# Patient Record
Sex: Male | Born: 1990 | Race: White | Hispanic: No | Marital: Single | Smoking: Never smoker
Health system: Southern US, Community
[De-identification: ages and names within clinical notes are randomized; demographics above are authoritative.]

## PROBLEM LIST (undated history)

## (undated) DIAGNOSIS — I10 Essential (primary) hypertension: Secondary | ICD-10-CM

## (undated) HISTORY — PX: TONSILLECTOMY: SUR1361

## (undated) HISTORY — DX: Essential (primary) hypertension: I10

---

## 2016-12-19 ENCOUNTER — Emergency Department
Admission: EM | Admit: 2016-12-19 | Discharge: 2016-12-19 | Disposition: A | Discharge status: Home / Self Care | Payer: BLUE CROSS/BLUE SHIELD | Attending: Emergency Medicine | Admitting: Emergency Medicine

## 2016-12-19 ENCOUNTER — Encounter: Payer: Self-pay | Admitting: Emergency Medicine

## 2016-12-19 DIAGNOSIS — W260XXA Contact with knife, initial encounter: Secondary | ICD-10-CM | POA: Insufficient documentation

## 2016-12-19 DIAGNOSIS — S61012A Laceration without foreign body of left thumb without damage to nail, initial encounter: Secondary | ICD-10-CM | POA: Diagnosis present

## 2016-12-19 DIAGNOSIS — Y929 Unspecified place or not applicable: Secondary | ICD-10-CM | POA: Insufficient documentation

## 2016-12-19 DIAGNOSIS — Z23 Encounter for immunization: Secondary | ICD-10-CM | POA: Diagnosis not present

## 2016-12-19 DIAGNOSIS — Y998 Other external cause status: Secondary | ICD-10-CM | POA: Insufficient documentation

## 2016-12-19 DIAGNOSIS — Y9389 Activity, other specified: Secondary | ICD-10-CM | POA: Insufficient documentation

## 2016-12-19 MED ORDER — TETANUS-DIPHTH-ACELL PERTUSSIS 5-2.5-18.5 LF-MCG/0.5 IM SUSP
0.5000 mL | Freq: Once | INTRAMUSCULAR | Status: AC
  Administered 2016-12-19: 0.5 mL via INTRAMUSCULAR
  Filled 2016-12-19: qty 0.5

## 2016-12-19 MED ORDER — LIDOCAINE HCL (PF) 1 % IJ SOLN
INTRAMUSCULAR | Status: AC
  Administered 2016-12-19: 5 mL
  Filled 2016-12-19: qty 5

## 2016-12-19 MED ORDER — CEPHALEXIN 500 MG PO CAPS: 500.0000 mg | ORAL_CAPSULE | Freq: Two times a day (BID) | ORAL | 0 refills | Status: AC

## 2016-12-19 MED ORDER — LIDOCAINE HCL 1 % IJ SOLN
5.0000 mL | Freq: Once | INTRAMUSCULAR | Status: AC
  Administered 2016-12-19: 5 mL

## 2016-12-19 NOTE — ED Provider Notes (Signed)
[facility] Emergency Department Provider Note  ____________________________________________  Time seen: Approximately 11:33 PM  I have reviewed the triage vital signs and the nursing notes.   HISTORY  Chief Complaint Laceration   HPI Kenneth Craig is a 25 y.o. male presenting to the emergency department with a 3 cm laceration localized to the dorsal aspect of the first digit, left of the skin overlying the first metacarpal. Patient states that he was playing with a new pocket knife when he accidentally cut himself. Patient denies prior traumas or surgeries to the left upper extremity. He irrigated and applied pressure to the wound immediately.  History reviewed. No pertinent past medical history.  There are no active problems to display for this patient.   History reviewed. No pertinent surgical history.  Prior to Admission medications   Medication Sig Start Date End Date Taking? Authorizing Provider  cephALEXin (KEFLEX) 500 MG capsule Take 1 capsule (500 mg total) by mouth 2 (two) times daily. 12/19/16 12/26/16  Orvil FeilJaclyn M Raymondo Garcialopez, PA-C    Allergies Patient has no known allergies.  No family history on file.  Social History Social History  Substance Use Topics  . Smoking status: Never Smoker  . Smokeless tobacco: Never Used  . Alcohol use No    Review of Systems  Constitutional: Negative for fever/chills Respiratory: Negative for shortness of breath. Musculoskeletal: Has left first digit pain.  Skin: Has laceration.  Neurological: Negative for headaches, focal weakness or numbness. ____________________________________________   PHYSICAL EXAM:  VITAL SIGNS: ED Triage Vitals  Enc Vitals Group     BP 12/19/16 2135 136/84     Pulse Rate 12/19/16 2135 76     Resp 12/19/16 2255 18     Temp 12/19/16 2135 97.9 F (36.6 C)     Temp Source 12/19/16 2135 Oral     SpO2 12/19/16 2135 100 %     Weight --      Height --      Head Circumference --       Peak Flow --      Pain Score 12/19/16 2144 6     Pain Loc --      Pain Edu? --      Excl. in [place]? --      Constitutional: Alert and oriented. Well appearing and in no acute distress. Neck: FROM.  Cardiovascular: Good peripheral circulation. Respiratory: Normal respiratory effort.  No retractions. Lungs CTAB. Musculoskeletal:  Patient has active flexion and extension at the thumb, left. Palpable radial and ulnar pulses, left. Patient is able to perform flexion and extension at the left wrist. Neurologic:  Normal speech and language. No gross focal neurologic deficits are appreciated. Skin:  Patient has a 3 cm linear laceration localized to the dorsal aspect of the left first digit. Laceration is localized to the skin overlying the first metacarpal.   ____________________________________________   LABS (all labs ordered are listed, but only abnormal results are displayed)  Labs Reviewed - No data to display ____________________________________________  EKG  ____________________________________________  RADIOLOGY   ____________________________________________   PROCEDURES  Procedure(s) performed:  LACERATION REPAIR Performed by: Orvil FeilJaclyn M Essie Gehret Authorized by: Orvil FeilJaclyn M Lida Berkery Consent: Verbal consent obtained. Risks and benefits: risks, benefits and alternatives were discussed Consent given by: patient Patient identity confirmed: provided demographic data Prepped and Draped in normal sterile fashion Wound explored  Laceration Location: Left first digit  Laceration Length: 3 cm  No Foreign Bodies seen or palpated  Anesthesia: local infiltration  Local  anesthetic: lidocaine 1% without epinephrine  Anesthetic total: 5 ml  Irrigation method: syringe Amount of cleaning: standard  Skin closure: 5-0 Ethilon.   Number of sutures: 6  Technique: Simple interrupted   Patient tolerance: Patient tolerated the procedure well with no immediate  complications.  ____________________________________________   INITIAL IMPRESSION / ASSESSMENT AND PLAN / ED COURSE  Clinical Course     Pertinent labs & imaging results that were available during my care of the patient were reviewed by me and considered in my medical decision making (see chart for details).  Assessment and Plan:   Patient underwent laceration repair in the emergency department tonight. Patient tolerated the procedure well. Patient was advised to have sutures removed in 8 days. Patient's tetanus status was updated in the emergency department tonight. Patient was discharged with Keflex. All patient questions were answered. ____________________________________________   FINAL CLINICAL IMPRESSION(S) / ED DIAGNOSES  Final diagnoses:  Laceration of left thumb without foreign body without damage to nail, initial encounter    Discharge Medication List as of 12/19/2016 10:41 PM    START taking these medications   Details  cephALEXin (KEFLEX) 500 MG capsule Take 1 capsule (500 mg total) by mouth 2 (two) times daily., Starting Wed 12/19/2016, Until Wed 12/26/2016, Print        Note:  This document was prepared using Dragon voice recognition software and may include unintentional dictation errors.    Orvil FeilJaclyn M Takeria Marquina, PA-C 12/19/16 [id number]    Loleta Roseory Forbach, MD 12/20/16 0000

## 2016-12-19 NOTE — ED Triage Notes (Addendum)
Pt to ED via POV for laceration to LFT thumb from cutting open a box tonight with pocket knife. Lac still slightly bleeding at this time, otherwise injury is clean, no other injuries noted

## 2019-07-21 ENCOUNTER — Emergency Department
Admission: EM | Admit: 2019-07-21 | Discharge: 2019-07-21 | Disposition: A | Discharge status: Home / Self Care | Payer: BC Managed Care – PPO | Source: Home / Self Care | Attending: Emergency Medicine | Admitting: Emergency Medicine

## 2019-07-21 ENCOUNTER — Encounter: Payer: Self-pay | Admitting: Emergency Medicine

## 2019-07-21 ENCOUNTER — Other Ambulatory Visit: Payer: Self-pay

## 2019-07-21 DIAGNOSIS — M7918 Myalgia, other site: Secondary | ICD-10-CM | POA: Insufficient documentation

## 2019-07-21 DIAGNOSIS — J988 Other specified respiratory disorders: Secondary | ICD-10-CM | POA: Insufficient documentation

## 2019-07-21 DIAGNOSIS — U071 COVID-19: Secondary | ICD-10-CM

## 2019-07-21 DIAGNOSIS — R509 Fever, unspecified: Secondary | ICD-10-CM | POA: Diagnosis present

## 2019-07-21 MED ORDER — ONDANSETRON 4 MG PO TBDP: 4.0000 mg | ORAL_TABLET | Freq: Three times a day (TID) | ORAL | 0 refills | Status: DC | PRN

## 2019-07-21 NOTE — ED Triage Notes (Signed)
C/O fever, chills, nausea, lower back pain x 4 days.  Ibuprofen last taken at 0130.  STates was seen through Urgent Care yesterday and was tested for COVID.  No results yet.

## 2019-07-21 NOTE — ED Provider Notes (Signed)
[facility] Emergency Department Provider Note   ____________________________________________    I have reviewed the triage vital signs and the nursing notes.   HISTORY  Chief Complaint Fever     HPI Kenneth Craig is a 28 y.o. male reports he has had fever and body aches for approximately 3 to 4 days.  He had coronavirus testing done yesterday but does not have the results yet.  Presents today because he reports decreased p.o. intake.  He reports he is tolerating liquids but occasionally feels nauseated.  Denies abdominal pain.  No diarrhea.  No shortness of breath or cough  History reviewed. No pertinent past medical history.  There are no active problems to display for this patient.   History reviewed. No pertinent surgical history.  Prior to Admission medications   Medication Sig Start Date End Date Taking? Authorizing Provider  ondansetron (ZOFRAN ODT) 4 MG disintegrating tablet Take 1 tablet (4 mg total) by mouth every 8 (eight) hours as needed. 07/21/19   Lavonia Drafts, MD     Allergies Patient has no known allergies.  No family history on file.  Social History Social History   Tobacco Use  . Smoking status: Never Smoker  . Smokeless tobacco: Never Used  Substance Use Topics  . Alcohol use: No  . Drug use: No    Review of Systems  Constitutional: Positive fever  ENT: No sore throat.   Gastrointestinal: No abdominal pain.    Genitourinary: Negative for dysuria.  Normal urination Musculoskeletal: Myalgias Skin: Negative for rash. Neurological: Occasional headaches    ____________________________________________   PHYSICAL EXAM:  VITAL SIGNS: ED Triage Vitals [07/21/19 0749]  Enc Vitals Group     BP (!) 158/96     Pulse Rate (!) 120     Resp 18     Temp (!) 100.5 F (38.1 C)     Temp Source Oral     SpO2 97 %     Weight (!) 155 kg (341 lb 11.4 oz)     Height 1.753 m (5\' 9" )     Head Circumference    Peak Flow      Pain Score 8     Pain Loc      Pain Edu?      Excl. in [place]?      Constitutional: Alert and oriented.  Eyes: Conjunctivae are normal.  Head: Atraumatic. Nose: No congestion/rhinnorhea. Mouth/Throat: Mucous membranes are moist.   Cardiovascular: Normal rate, regular rhythm.  Respiratory: Normal respiratory effort.  No retractions.  Musculoskeletal: No lower extremity tenderness nor edema.   Neurologic:  Normal speech and language. No gross focal neurologic deficits are appreciated.   Skin:  Skin is warm, dry and intact. No rash noted.   ____________________________________________   LABS (all labs ordered are listed, but only abnormal results are displayed)  Labs Reviewed - No data to display ____________________________________________  EKG   ____________________________________________  RADIOLOGY  None ____________________________________________   PROCEDURES  Procedure(s) performed: No  Procedures   Critical Care performed: No ____________________________________________   INITIAL IMPRESSION / ASSESSMENT AND PLAN / ED COURSE  Pertinent labs & imaging results that were available during my care of the patient were reviewed by me and considered in my medical decision making (see chart for details).  Patient presents with symptoms consistent with COVID-19.  Overall he is well-appearing and in no acute distress.  Minimally tachycardic likely from fever.  Clinically does not appear dehydrated is tolerating p.o., will add  Zofran to help him tolerate p.o.'s.  Strict quarantine discussed with patient   ____________________________________________   FINAL CLINICAL IMPRESSION(S) / ED DIAGNOSES  Final diagnoses:  COVID-19      NEW MEDICATIONS STARTED DURING THIS VISIT:  New Prescriptions   ONDANSETRON (ZOFRAN ODT) 4 MG DISINTEGRATING TABLET    Take 1 tablet (4 mg total) by mouth every 8 (eight) hours as needed.     Note:  This document  was prepared using Dragon voice recognition software and may include unintentional dictation errors.   Jene EveryKinner, Yecheskel Kurek, MD 07/21/19 [phone]

## 2019-07-22 ENCOUNTER — Emergency Department: Payer: BC Managed Care – PPO

## 2019-07-22 ENCOUNTER — Inpatient Hospital Stay
Admission: EM | Admit: 2019-07-22 | Discharge: 2019-07-27 | DRG: 872 | Disposition: A | Discharge status: Home / Self Care | Payer: BC Managed Care – PPO | Attending: Internal Medicine | Admitting: Internal Medicine

## 2019-07-22 ENCOUNTER — Encounter: Payer: Self-pay | Admitting: Emergency Medicine

## 2019-07-22 ENCOUNTER — Other Ambulatory Visit: Payer: Self-pay

## 2019-07-22 DIAGNOSIS — Z791 Long term (current) use of non-steroidal anti-inflammatories (NSAID): Secondary | ICD-10-CM

## 2019-07-22 DIAGNOSIS — M549 Dorsalgia, unspecified: Secondary | ICD-10-CM | POA: Diagnosis not present

## 2019-07-22 DIAGNOSIS — D72829 Elevated white blood cell count, unspecified: Secondary | ICD-10-CM | POA: Diagnosis not present

## 2019-07-22 DIAGNOSIS — R519 Headache, unspecified: Secondary | ICD-10-CM

## 2019-07-22 DIAGNOSIS — K219 Gastro-esophageal reflux disease without esophagitis: Secondary | ICD-10-CM | POA: Diagnosis present

## 2019-07-22 DIAGNOSIS — Z79899 Other long term (current) drug therapy: Secondary | ICD-10-CM

## 2019-07-22 DIAGNOSIS — Z20828 Contact with and (suspected) exposure to other viral communicable diseases: Secondary | ICD-10-CM | POA: Diagnosis present

## 2019-07-22 DIAGNOSIS — N179 Acute kidney failure, unspecified: Secondary | ICD-10-CM | POA: Diagnosis present

## 2019-07-22 DIAGNOSIS — A870 Enteroviral meningitis: Secondary | ICD-10-CM | POA: Diagnosis present

## 2019-07-22 DIAGNOSIS — A419 Sepsis, unspecified organism: Secondary | ICD-10-CM | POA: Diagnosis not present

## 2019-07-22 DIAGNOSIS — R51 Headache: Secondary | ICD-10-CM

## 2019-07-22 DIAGNOSIS — K760 Fatty (change of) liver, not elsewhere classified: Secondary | ICD-10-CM | POA: Diagnosis present

## 2019-07-22 DIAGNOSIS — R945 Abnormal results of liver function studies: Secondary | ICD-10-CM | POA: Diagnosis not present

## 2019-07-22 DIAGNOSIS — F1290 Cannabis use, unspecified, uncomplicated: Secondary | ICD-10-CM | POA: Diagnosis present

## 2019-07-22 DIAGNOSIS — G039 Meningitis, unspecified: Secondary | ICD-10-CM | POA: Diagnosis present

## 2019-07-22 DIAGNOSIS — R509 Fever, unspecified: Secondary | ICD-10-CM | POA: Diagnosis not present

## 2019-07-22 DIAGNOSIS — Z6841 Body Mass Index (BMI) 40.0 and over, adult: Secondary | ICD-10-CM | POA: Diagnosis not present

## 2019-07-22 DIAGNOSIS — R21 Rash and other nonspecific skin eruption: Secondary | ICD-10-CM

## 2019-07-22 DIAGNOSIS — M542 Cervicalgia: Secondary | ICD-10-CM

## 2019-07-22 DIAGNOSIS — I10 Essential (primary) hypertension: Secondary | ICD-10-CM | POA: Diagnosis present

## 2019-07-22 DIAGNOSIS — E860 Dehydration: Secondary | ICD-10-CM

## 2019-07-22 LAB — CBC WITH DIFFERENTIAL/PLATELET
Abs Immature Granulocytes: 0.15 10*3/uL — ABNORMAL HIGH (ref 0.00–0.07)
Basophils Absolute: 0.1 10*3/uL (ref 0.0–0.1)
Basophils Relative: 1 %
Eosinophils Absolute: 0 10*3/uL (ref 0.0–0.5)
Eosinophils Relative: 0 %
HCT: 44.8 % (ref 39.0–52.0)
Hemoglobin: 15 g/dL (ref 13.0–17.0)
Immature Granulocytes: 1 %
Lymphocytes Relative: 5 %
Lymphs Abs: 0.8 10*3/uL (ref 0.7–4.0)
MCH: 27.6 pg (ref 26.0–34.0)
MCHC: 33.5 g/dL (ref 30.0–36.0)
MCV: 82.4 fL (ref 80.0–100.0)
Monocytes Absolute: 1 10*3/uL (ref 0.1–1.0)
Monocytes Relative: 7 %
Neutro Abs: 13.3 10*3/uL — ABNORMAL HIGH (ref 1.7–7.7)
Neutrophils Relative %: 86 %
Platelets: 234 10*3/uL (ref 150–400)
RBC: 5.44 MIL/uL (ref 4.22–5.81)
RDW: 12.7 % (ref 11.5–15.5)
WBC: 15.4 10*3/uL — ABNORMAL HIGH (ref 4.0–10.5)
nRBC: 0 % (ref 0.0–0.2)

## 2019-07-22 LAB — COMPREHENSIVE METABOLIC PANEL
ALT: 148 U/L — ABNORMAL HIGH (ref 0–44)
AST: 46 U/L — ABNORMAL HIGH (ref 15–41)
Albumin: 3.7 g/dL (ref 3.5–5.0)
Alkaline Phosphatase: 70 U/L (ref 38–126)
Anion gap: 13 (ref 5–15)
BUN: 16 mg/dL (ref 6–20)
CO2: 26 mmol/L (ref 22–32)
Calcium: 9.2 mg/dL (ref 8.9–10.3)
Chloride: 96 mmol/L — ABNORMAL LOW (ref 98–111)
Creatinine, Ser: 1.39 mg/dL — ABNORMAL HIGH (ref 0.61–1.24)
GFR calc Af Amer: >60 mL/min (ref >60)
GFR calc non Af Amer: >60 mL/min (ref >60)
Glucose, Bld: 128 mg/dL — ABNORMAL HIGH (ref 70–99)
Potassium: 4.2 mmol/L (ref 3.5–5.1)
Sodium: 135 mmol/L (ref 135–145)
Total Bilirubin: 1.3 mg/dL — ABNORMAL HIGH (ref 0.3–1.2)
Total Protein: 8.3 g/dL — ABNORMAL HIGH (ref 6.5–8.1)

## 2019-07-22 LAB — URINALYSIS, COMPLETE (UACMP) WITH MICROSCOPIC
Bacteria, UA: NONE SEEN
Bilirubin Urine: NEGATIVE
Glucose, UA: NEGATIVE mg/dL
Hgb urine dipstick: NEGATIVE
Ketones, ur: 20 mg/dL — AB
Leukocytes,Ua: NEGATIVE
Nitrite: NEGATIVE
Protein, ur: 30 mg/dL — AB
Specific Gravity, Urine: 1.02 (ref 1.005–1.030)
pH: 5 (ref 5.0–8.0)

## 2019-07-22 LAB — SARS CORONAVIRUS 2 BY RT PCR (HOSPITAL ORDER, PERFORMED IN ~~LOC~~ HOSPITAL LAB): SARS Coronavirus 2: NEGATIVE

## 2019-07-22 LAB — PROCALCITONIN: Procalcitonin: 0.83 ng/mL

## 2019-07-22 LAB — LIPASE, BLOOD: Lipase: 19 U/L (ref 11–51)

## 2019-07-22 LAB — LACTIC ACID, PLASMA
Lactic Acid, Venous: 1.5 mmol/L (ref 0.5–1.9)
Lactic Acid, Venous: 1.5 mmol/L (ref 0.5–1.9)

## 2019-07-22 MED ORDER — VANCOMYCIN HCL 10 G IV SOLR
2500.0000 mg | Freq: Once | INTRAVENOUS | Status: DC
  Administered 2019-07-22: 2500 mg via INTRAVENOUS
  Filled 2019-07-22: qty 2500

## 2019-07-22 MED ORDER — ONDANSETRON HCL 4 MG/2ML IJ SOLN: 4.0000 mg | Freq: Four times a day (QID) | INTRAMUSCULAR | Status: DC | PRN

## 2019-07-22 MED ORDER — ACETAMINOPHEN 650 MG RE SUPP: 650.0000 mg | Freq: Four times a day (QID) | RECTAL | Status: DC | PRN

## 2019-07-22 MED ORDER — VANCOMYCIN HCL 10 G IV SOLR: 1250.0000 mg | Freq: Two times a day (BID) | INTRAVENOUS | Status: DC

## 2019-07-22 MED ORDER — KETOROLAC TROMETHAMINE 30 MG/ML IJ SOLN
15.0000 mg | INTRAMUSCULAR | Status: AC
  Administered 2019-07-22: 15 mg via INTRAVENOUS
  Filled 2019-07-22: qty 1

## 2019-07-22 MED ORDER — DEXTROSE 5 % IV SOLN
10.0000 mg/kg | Freq: Once | INTRAVENOUS | Status: AC
  Administered 2019-07-22: 17:00:00 1540 mg via INTRAVENOUS
  Filled 2019-07-22: qty 20

## 2019-07-22 MED ORDER — SODIUM CHLORIDE 0.9 % IV BOLUS
1000.0000 mL | Freq: Once | INTRAVENOUS | Status: AC
  Administered 2019-07-22: 1000 mL via INTRAVENOUS

## 2019-07-22 MED ORDER — SODIUM CHLORIDE 0.9 % IV SOLN
2.0000 g | Freq: Once | INTRAVENOUS | Status: DC
  Filled 2019-07-22: qty 2000

## 2019-07-22 MED ORDER — SODIUM CHLORIDE 0.9 % IV SOLN: 2.0000 g | INTRAVENOUS | Status: DC

## 2019-07-22 MED ORDER — ACETAMINOPHEN 325 MG PO TABS
650.0000 mg | ORAL_TABLET | Freq: Four times a day (QID) | ORAL | Status: DC | PRN
  Administered 2019-07-24 – 2019-07-25 (×5): 650 mg via ORAL
  Filled 2019-07-22 (×5): qty 2

## 2019-07-22 MED ORDER — MORPHINE SULFATE (PF) 2 MG/ML IV SOLN
2.0000 mg | INTRAVENOUS | Status: DC | PRN
  Administered 2019-07-23 (×2): 2 mg via INTRAVENOUS
  Filled 2019-07-22 (×2): qty 1

## 2019-07-22 MED ORDER — SODIUM CHLORIDE 0.9 % IV SOLN
2.0000 g | Freq: Two times a day (BID) | INTRAVENOUS | Status: DC
  Administered 2019-07-23 – 2019-07-25 (×5): 2 g via INTRAVENOUS
  Filled 2019-07-22 (×2): qty 2
  Filled 2019-07-22 (×2): qty 20
  Filled 2019-07-22: qty 2
  Filled 2019-07-22: qty 20

## 2019-07-22 MED ORDER — SODIUM CHLORIDE 0.9 % IV SOLN
2.0000 g | Freq: Once | INTRAVENOUS | Status: AC
  Administered 2019-07-22: 17:00:00 2 g via INTRAVENOUS
  Filled 2019-07-22: qty 20

## 2019-07-22 MED ORDER — ALUM & MAG HYDROXIDE-SIMETH 200-200-20 MG/5ML PO SUSP
30.0000 mL | Freq: Once | ORAL | Status: AC
  Administered 2019-07-22: 30 mL via ORAL
  Filled 2019-07-22: qty 30

## 2019-07-22 MED ORDER — ONDANSETRON HCL 4 MG/2ML IJ SOLN
4.0000 mg | Freq: Once | INTRAMUSCULAR | Status: AC
  Administered 2019-07-22: 12:00:00 4 mg via INTRAVENOUS
  Filled 2019-07-22: qty 2

## 2019-07-22 MED ORDER — SODIUM CHLORIDE 0.9 % IV SOLN
INTRAVENOUS | Status: DC
  Administered 2019-07-22 – 2019-07-24 (×4): via INTRAVENOUS

## 2019-07-22 MED ORDER — SODIUM CHLORIDE 0.9 % IV SOLN
100.0000 mg | Freq: Two times a day (BID) | INTRAVENOUS | Status: DC
  Administered 2019-07-22 – 2019-07-23 (×2): 100 mg via INTRAVENOUS
  Filled 2019-07-22 (×3): qty 100

## 2019-07-22 MED ORDER — DEXAMETHASONE SODIUM PHOSPHATE 10 MG/ML IJ SOLN
10.0000 mg | Freq: Once | INTRAMUSCULAR | Status: AC
  Administered 2019-07-22: 10 mg via INTRAVENOUS
  Filled 2019-07-22: qty 1

## 2019-07-22 MED ORDER — HYDROMORPHONE HCL 1 MG/ML IJ SOLN
1.0000 mg | Freq: Once | INTRAMUSCULAR | Status: AC
  Administered 2019-07-22: 1 mg via INTRAVENOUS
  Filled 2019-07-22: qty 1

## 2019-07-22 MED ORDER — METOCLOPRAMIDE HCL 5 MG/ML IJ SOLN
10.0000 mg | Freq: Once | INTRAMUSCULAR | Status: AC
  Administered 2019-07-22: 10 mg via INTRAVENOUS
  Filled 2019-07-22: qty 2

## 2019-07-22 MED ORDER — DIPHENHYDRAMINE HCL 50 MG/ML IJ SOLN
50.0000 mg | Freq: Once | INTRAMUSCULAR | Status: AC
  Administered 2019-07-22: 12:00:00 50 mg via INTRAVENOUS
  Filled 2019-07-22: qty 1

## 2019-07-22 MED ORDER — HYDROCODONE-ACETAMINOPHEN 5-325 MG PO TABS
1.0000 | ORAL_TABLET | ORAL | Status: DC | PRN
  Administered 2019-07-22: 20:00:00 1 via ORAL
  Administered 2019-07-23: 19:00:00 2 via ORAL
  Administered 2019-07-23: 1 via ORAL
  Filled 2019-07-22 (×2): qty 1
  Filled 2019-07-22: qty 2

## 2019-07-22 MED ORDER — ONDANSETRON HCL 4 MG PO TABS: 4.0000 mg | ORAL_TABLET | Freq: Four times a day (QID) | ORAL | Status: DC | PRN

## 2019-07-22 NOTE — ED Notes (Signed)
Attempt to call report unsuccessful. Will try again in 15 min.

## 2019-07-22 NOTE — Progress Notes (Signed)
2014: [facility] RN called to say that, "... there was not an order for a spinal tap, only labs to be drawn and that there needs to be an order placed; also the tap will take place tomorrow". 2020: Hospitalist/PA paged at [phone], to notify of elevated temperature of 101.5 and also spinal tap order to be placed. 2037: Secure chat sent to A. Seals,PA, notifying of elevated temp and order need for spinal tap.  Awaiting acknowledgement. Barbaraann Faster, RN 7/29/20209:01 PM

## 2019-07-22 NOTE — Progress Notes (Signed)
PHARMACY -  BRIEF ANTIBIOTIC NOTE   Pharmacy has received consult(s) for Vancomycin from an ED provider.  The patient's profile has been reviewed for ht/wt/allergies/indication/available labs.    One time order(s) placed for Vancomycin 2500mg  IV.  Further antibiotics/pharmacy consults should be ordered by admitting physician if indicated.                       Thank you, Pearla Dubonnet 07/22/2019  4:24 PM

## 2019-07-22 NOTE — ED Notes (Signed)
Arm repositioned to allow fluid to infuse. Updated pt that MD is in an emergency and when he is free I will find an update for him. Pt verbalized understanding.

## 2019-07-22 NOTE — ED Notes (Signed)
Antibiotics infusing. Pt says the pain meds make him feel like he is flying but it still hurts. Pt is aware of pending spinal tap. Pt is laying in bed with eyes closed. No acute distress. Pt sats decrese when he sleeps to 92%. 2L of o2 applied via nasal cannula.

## 2019-07-22 NOTE — ED Triage Notes (Signed)
Pt here with c/o headache that began Friday, worsening the past few days, was COVID tested at [facility] and resulted negative. Vomiting last pm, however, none today, pt states he feels dehydrated, having acid reflux when he lays down as well. NAD.

## 2019-07-22 NOTE — H&P (Signed)
[facility] [place] [facility]   PATIENT NAME: Kenneth Craig    MR#:  [id number]  DATE OF BIRTH:  01/19/1991  DATE OF ADMISSION:  07/22/2019  PRIMARY CARE PHYSICIAN: Duanne LimerickJones, Deanna C, MD   REQUESTING/REFERRING PHYSICIAN: Sharman CheekStafford, Phillip, MD  CHIEF COMPLAINT:   Chief Complaint  Patient presents with  . Headache  . Gastroesophageal Reflux    HISTORY OF PRESENT ILLNESS: Kenneth Craig  is a 28 y.o. male with no known medical problem who presents with fevers headache and neck stiffness.  Patient states that his symptoms started last Saturday.  When he started having fevers.  He went to [facility] and had a COVID test which was negative.  He continued to have fevers and started having frontal headache as well as neck stiffness.  In the emergency room he is again noticed to have fever and elevated WBC count.  Radiology is planning to do a lumbar puncture on him.  COVID tested again recheck was negative.  He also has had nausea and vomiting and some diarrhea as well.  Denies any sick contact.       PAST MEDICAL HISTORY:  History reviewed. No pertinent past medical history.  PAST SURGICAL HISTORY: History reviewed. No pertinent surgical history.  SOCIAL HISTORY:  Social History   Tobacco Use  . Smoking status: Never Smoker  . Smokeless tobacco: Never Used  Substance Use Topics  . Alcohol use: No    FAMILY HISTORY: Patient states that his dad smokes and does have chronic medical illnesses but not sure what they are   DRUG ALLERGIES: No Known Allergies  REVIEW OF SYSTEMS:   CONSTITUTIONAL: Positive fever, fatigue or weakness.  EYES: No blurred or double vision.  EARS, NOSE, AND THROAT: No tinnitus or ear pain.  RESPIRATORY: No cough, shortness of breath, wheezing or hemoptysis.  CARDIOVASCULAR: No chest pain, orthopnea, edema.  GASTROINTESTINAL: No nausea, vomiting, diarrhea or abdominal pain.  GENITOURINARY: No dysuria, hematuria.  ENDOCRINE: No polyuria,  nocturia,  HEMATOLOGY: No anemia, easy bruising or bleeding SKIN: No rash or lesion. MUSCULOSKELETAL: No joint pain or arthritis.   NEUROLOGIC: No tingling, numbness, weakness.  Positive headache PSYCHIATRY: No anxiety or depression.   MEDICATIONS AT HOME:  Prior to Admission medications   Medication Sig Start Date End Date Taking? Authorizing Provider  ondansetron (ZOFRAN ODT) 4 MG disintegrating tablet Take 1 tablet (4 mg total) by mouth every 8 (eight) hours as needed. 07/21/19   Jene EveryKinner, Robert, MD      PHYSICAL EXAMINATION:   VITAL SIGNS: Blood pressure (!) 168/73, pulse (!) 121, temperature 99 F (37.2 C), temperature source Oral, resp. rate 16, height 5\' 9"  (1.753 m), weight (!) 154.2 kg, SpO2 98 %.  GENERAL:  28 y.o.-year-old patient lying in the bed with no acute distress.  EYES: Pupils equal, round, reactive to light and accommodation. No scleral icterus. Extraocular muscles intact.  HEENT: Head atraumatic, normocephalic. Oropharynx and nasopharynx clear.  NECK:  Supple, no jugular venous distention. No thyroid enlargement, no tenderness.  LUNGS: Normal breath sounds bilaterally, no wheezing, rales,rhonchi or crepitation. No use of accessory muscles of respiration.  CARDIOVASCULAR: S1, S2 normal. No murmurs, rubs, or gallops.  ABDOMEN: Soft, nontender, nondistended. Bowel sounds present. No organomegaly or mass.  EXTREMITIES: No pedal edema, cyanosis, or clubbing.  NEUROLOGIC: Cranial nerves II through XII are intact. Muscle strength 5/5 in all extremities. Sensation intact. Gait not checked.  Patient does have a neck stiffness PSYCHIATRIC: The patient is alert and  oriented x 3.  SKIN: No obvious rash, lesion, or ulcer.   LABORATORY PANEL:   CBC Recent Labs  Lab 07/22/19 1122  WBC 15.4*  HGB 15.0  HCT 44.8  PLT 234  MCV 82.4  MCH 27.6  MCHC 33.5  RDW 12.7  LYMPHSABS 0.8  MONOABS 1.0  EOSABS 0.0  BASOSABS 0.1    ------------------------------------------------------------------------------------------------------------------  Chemistries  Recent Labs  Lab 07/22/19 1122  NA 135  K 4.2  CL 96*  CO2 26  GLUCOSE 128*  BUN 16  CREATININE 1.39*  CALCIUM 9.2  AST 46*  ALT 148*  ALKPHOS 70  BILITOT 1.3*   ------------------------------------------------------------------------------------------------------------------ estimated creatinine clearance is 118.6 mL/min (A) (by C-G formula based on SCr of 1.39 mg/dL (H)). ------------------------------------------------------------------------------------------------------------------ No results for input(s): TSH, T4TOTAL, T3FREE, THYROIDAB in the last 72 hours.  Invalid input(s): FREET3   Coagulation profile No results for input(s): INR, PROTIME in the last 168 hours. ------------------------------------------------------------------------------------------------------------------- No results for input(s): DDIMER in the last 72 hours. -------------------------------------------------------------------------------------------------------------------  Cardiac Enzymes No results for input(s): CKMB, TROPONINI, MYOGLOBIN in the last 168 hours.  Invalid input(s): CK ------------------------------------------------------------------------------------------------------------------ Invalid input(s): POCBNP  ---------------------------------------------------------------------------------------------------------------  Urinalysis    Component Value Date/Time   COLORURINE AMBER (A) 07/22/2019 1122   APPEARANCEUR HAZY (A) 07/22/2019 1122   LABSPEC 1.020 07/22/2019 1122   PHURINE 5.0 07/22/2019 1122   GLUCOSEU NEGATIVE 07/22/2019 1122   HGBUR NEGATIVE 07/22/2019 1122   BILIRUBINUR NEGATIVE 07/22/2019 1122   KETONESUR 20 (A) 07/22/2019 1122   PROTEINUR 30 (A) 07/22/2019 1122   NITRITE NEGATIVE 07/22/2019 1122   LEUKOCYTESUR NEGATIVE  07/22/2019 1122     RADIOLOGY: Ct Head Wo Contrast  Result Date: 07/22/2019 CLINICAL DATA:  28 year old male with headache for 5 days. Vomiting last night. Reportedly COVID-19 negative recently. EXAM: CT HEAD WITHOUT CONTRAST TECHNIQUE: Contiguous axial images were obtained from the base of the skull through the vertex without intravenous contrast. COMPARISON:  None. FINDINGS: Brain: Cerebral volume is within normal limits. 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 No midline shift, ventriculomegaly, mass effect, evidence of mass lesion, intracranial hemorrhage or evidence of cortically based acute infarction. Gray-white matter differentiation is within normal limits throughout the brain. Vascular: No suspicious intracranial vascular hyperdensity. Skull: Negative. Sinuses/Orbits: Visualized paranasal sinuses and mastoids are clear. Other: Negative visible orbit and scalp soft tissues. IMPRESSION: Essentially normal noncontrast head CT; asymmetry of the torcula which appears to be a normal anatomic variation. Electronically Signed   By: Genevie Ann M.D.   On: 07/22/2019 12:20   Dg Chest Portable 1 View  Result Date: 07/22/2019 CLINICAL DATA:  Worsening headaches over the last 5 days. Dehydration. Reported recent negative COVID-19 testing. EXAM: PORTABLE CHEST 1 VIEW COMPARISON:  None. FINDINGS: 1102 hours. Lordotic positioning. The heart size and mediastinal contours are normal. The lungs are clear. There is no pleural effusion or pneumothorax. No acute osseous findings are identified. IMPRESSION: No active cardiopulmonary process. Electronically Signed   By: Richardean Sale M.D.   On: 07/22/2019 11:41   US Abdomen Limited Ruq  Result Date: 07/22/2019 CLINICAL DATA:  Fever and elevated LFTs. EXAM: ULTRASOUND ABDOMEN LIMITED RIGHT UPPER QUADRANT COMPARISON:  None. FINDINGS: Gallbladder: Physiologically  distended. No gallstones or wall thickening visualized. No sonographic Murphy sign noted by sonographer. Common bile duct: Diameter: 5 mm. Liver: No focal lesion identified. Heterogeneously increased in parenchymal echogenicity. Portal vein is patent  on color Doppler imaging with normal direction of blood flow towards the liver. Other: Technically limited exam due to body habitus. IMPRESSION: 1. Hepatic steatosis without evidence of focal lesion. 2. Normal sonographic appearance of the gallbladder and biliary tree. Electronically Signed   By: Narda RutherfordMelanie  Sanford M.D.   On: 07/22/2019 14:29    EKG: No orders found for this or any previous visit.  IMPRESSION AND PLAN: Patient is 28 year old white male presenting with neck pain and fever.  1.  Neck pain and fever's highly suspicion for meningitis we will cover patient with ceftriaxone and vancomycin.  Interventional radiology is planning to do LP on this patient Infectious disease doctor has been notified Check HIV And also has empirically received acyclovir in the emergency room as well as a dose of IV Decadron I will hold off on this unless ID physician feels that these are needed  2.  Acute kidney injury we will give IV fluids likely due to dehydration   3.  Elevated LFTs I will check a hepatitis panel  4.  SCDs for DVT prophylaxis  All the records are reviewed and case discussed with ED provider. Management plans discussed with the patient, family and they are in agreement.  CODE STATUS: Full    TOTAL TIME TAKING CARE OF THIS PATIENT: 55 minutes.    Auburn BilberryShreyang Telsa Dillavou M.D on 07/22/2019 at 4:44 PM  Between 7am to 6pm - Pager - [phone]  After 6pm go to [email or web address] - password [email or web address]  [employer] [facility] Office  [phone]  CC: Primary care physician; Duanne LimerickJones, Deanna C, MD

## 2019-07-22 NOTE — ED Notes (Signed)
ED TO INPATIENT HANDOFF REPORT  ED Nurse Name and Phone #:  Toma CopierSherie [phone]  S Name/Age/Gender Kenneth Craig CurrentJacob Craig 28 y.o. male Room/Bed: ED17A/ED17A  Code Status   Code Status: Not on file  Home/SNF/Other Home Patient oriented to: self, place, time and situation Is this baseline? Yes   Triage Complete: Triage complete  Chief Complaint headach  Triage Note Pt here with c/o headache that began Friday, worsening the past few days, was COVID tested at [facility] and resulted negative. Vomiting last pm, however, none today, pt states he feels dehydrated, having acid reflux when he lays down as well. NAD.    Allergies No Known Allergies  Level of Care/Admitting Diagnosis ED Disposition    ED Disposition Condition Comment   Admit  [facility]ospital [facility] [[id number]]  Level of Care: Med-Surg [16]  Covid Evaluation: Confirmed COVID Negative  Diagnosis: Meningitis [242217]  Admitting Physician: Auburn BilberryEL, SHREYANG [[id number]]  Attending Physician: Auburn BilberryPATEL, SHREYANG [[id number]]  Estimated length of stay: past midnight tomorrow  Certification:: I certify this patient will need inpatient services for at least 2 midnights  PT Class (Do Not Modify): Inpatient [101]  PT Acc Code (Do Not Modify): Private [1]       B Medical/Surgery History History reviewed. No pertinent past medical history. History reviewed. No pertinent surgical history.   A IV Location/Drains/Wounds Patient Lines/Drains/Airways Status   Active Line/Drains/Airways    Name:   Placement date:   Placement time:   Site:   Days:   Peripheral IV 07/22/19 Left Antecubital   07/22/19    1125    Antecubital   less than 1   Peripheral IV 07/22/19 Right;Medial Antecubital   07/22/19    1741    Antecubital   less than 1          Intake/Output Last 24 hours No intake or output data in the 24 hours ending 07/22/19 1743  Labs/Imaging Results for orders placed or performed during the hospital encounter of 07/22/19 (from  the past 48 hour(s))  CBC with Differential     Status: Abnormal   Collection Time: 07/22/19 11:22 AM  Result Value Ref Range   WBC 15.4 (H) 4.0 - 10.5 K/uL   RBC 5.44 4.22 - 5.81 MIL/uL   Hemoglobin 15.0 13.0 - 17.0 g/dL   HCT 14.744.8 82.939.0 - 56.252.0 %   MCV 82.4 80.0 - 100.0 fL   MCH 27.6 26.0 - 34.0 pg   MCHC 33.5 30.0 - 36.0 g/dL   RDW 13.012.7 86.511.5 - 78.415.5 %   Platelets 234 150 - 400 K/uL   nRBC 0.0 0.0 - 0.2 %   Neutrophils Relative % 86 %   Neutro Abs 13.3 (H) 1.7 - 7.7 K/uL   Lymphocytes Relative 5 %   Lymphs Abs 0.8 0.7 - 4.0 K/uL   Monocytes Relative 7 %   Monocytes Absolute 1.0 0.1 - 1.0 K/uL   Eosinophils Relative 0 %   Eosinophils Absolute 0.0 0.0 - 0.5 K/uL   Basophils Relative 1 %   Basophils Absolute 0.1 0.0 - 0.1 K/uL   Immature Granulocytes 1 %   Abs Immature Granulocytes 0.15 (H) 0.00 - 0.07 K/uL    Comment: Performed at [facility], [address] [place], [place] [address]  Comprehensive metabolic panel     Status: Abnormal   Collection Time: 07/22/19 11:22 AM  Result Value Ref Range   Sodium 135 135 - 145 mmol/L   Potassium 4.2 3.5 - 5.1 mmol/L  Chloride 96 (L) 98 - 111 mmol/L   CO2 26 22 - 32 mmol/L   Glucose, Bld 128 (H) 70 - 99 mg/dL   BUN 16 6 - 20 mg/dL   Creatinine, Ser 1.611.39 (H) 0.61 - 1.24 mg/dL   Calcium 9.2 8.9 - 09.610.3 mg/dL   Total Protein 8.3 (H) 6.5 - 8.1 g/dL   Albumin 3.7 3.5 - 5.0 g/dL   AST 46 (H) 15 - 41 U/L   ALT 148 (H) 0 - 44 U/L   Alkaline Phosphatase 70 38 - 126 U/L   Total Bilirubin 1.3 (H) 0.3 - 1.2 mg/dL   GFR calc non Af Amer >60 >60 mL/min   GFR calc Af Amer >60 >60 mL/min   Anion gap 13 5 - 15    Comment: Performed at [facility], [address] [place], [place] [address]  Lipase, blood     Status: None   Collection Time: 07/22/19 11:22 AM  Result Value Ref Range   Lipase 19 11 - 51 U/L    Comment: Performed at [facility], [address] [place], [place] [address]  Urinalysis, Complete w  Microscopic     Status: Abnormal   Collection Time: 07/22/19 11:22 AM  Result Value Ref Range   Color, Urine AMBER (A) YELLOW    Comment: BIOCHEMICALS MAY BE AFFECTED BY COLOR   APPearance HAZY (A) CLEAR   Specific Gravity, Urine 1.020 1.005 - 1.030   pH 5.0 5.0 - 8.0   Glucose, UA NEGATIVE NEGATIVE mg/dL   Hgb urine dipstick NEGATIVE NEGATIVE   Bilirubin Urine NEGATIVE NEGATIVE   Ketones, ur 20 (A) NEGATIVE mg/dL   Protein, ur 30 (A) NEGATIVE mg/dL   Nitrite NEGATIVE NEGATIVE   Leukocytes,Ua NEGATIVE NEGATIVE   RBC / HPF 0-5 0 - 5 RBC/hpf   WBC, UA 0-5 0 - 5 WBC/hpf   Bacteria, UA NONE SEEN NONE SEEN   Squamous Epithelial / LPF 0-5 0 - 5   Mucus PRESENT     Comment: Performed at [facility], [address] [place], [place] [address]  SARS Coronavirus 2 (CEPHEID- Performed in [facility]), Hosp Order     Status: None   Collection Time: 07/22/19 12:13 PM   Specimen: Nasopharyngeal Swab  Result Value Ref Range   SARS Coronavirus 2 NEGATIVE NEGATIVE    Comment: (NOTE) If result is NEGATIVE SARS-CoV-2 target nucleic acids are NOT DETECTED. The SARS-CoV-2 RNA is generally detectable in upper and lower  respiratory specimens during the acute phase of infection. The lowest  concentration of SARS-CoV-2 viral copies this assay can detect is 250  copies / mL. A negative result does not preclude SARS-CoV-2 infection  and should not be used as the sole basis for treatment or other  patient management decisions.  A negative result may occur with  improper specimen collection / handling, submission of specimen other  than nasopharyngeal swab, presence of viral mutation(s) within the  areas targeted by this assay, and inadequate number of viral copies  (<250 copies / mL). A negative result must be combined with clinical  observations, patient history, and epidemiological information. If result is POSITIVE SARS-CoV-2 target nucleic acids are DETECTED. The SARS-CoV-2  RNA is generally detectable in upper and lower  respiratory specimens dur ing the acute phase of infection.  Positive  results are indicative of active infection with SARS-CoV-2.  Clinical  correlation with patient history and other diagnostic information is  necessary to determine  patient infection status.  Positive results do  not rule out bacterial infection or co-infection with other viruses. If result is PRESUMPTIVE POSTIVE SARS-CoV-2 nucleic acids MAY BE PRESENT.   A presumptive positive result was obtained on the submitted specimen  and confirmed on repeat testing.  While 2019 novel coronavirus  (SARS-CoV-2) nucleic acids may be present in the submitted sample  additional confirmatory testing may be necessary for epidemiological  and / or clinical management purposes  to differentiate between  SARS-CoV-2 and other Sarbecovirus currently known to infect humans.  If clinically indicated additional testing with an alternate test  methodology [phone]) is advised. The SARS-CoV-2 RNA is generally  detectable in upper and lower respiratory sp ecimens during the acute  phase of infection. The expected result is Negative. Fact Sheet for Patients:  BoilerBrush.com.cy Fact Sheet for Healthcare Providers: https://pope.com/ This test is not yet approved or cleared by the [place] FDA and has been authorized for detection and/or diagnosis of SARS-CoV-2 by FDA under an Emergency Use Authorization (EUA).  This EUA will remain in effect (meaning this test can be used) for the duration of the COVID-19 declaration under Section 564(b)(1) of the Act, 21 U.S.C. section 360bbb-3(b)(1), unless the authorization is terminated or revoked sooner. Performed at [facility], [address] [place], [place] [address]    Ct Head Wo Contrast  Result Date: 07/22/2019 CLINICAL DATA:  28 year old male with headache for 5 days. Vomiting last  night. Reportedly COVID-19 negative recently. EXAM: CT HEAD WITHOUT CONTRAST TECHNIQUE: Contiguous axial images were obtained from the base of the skull through the vertex without intravenous contrast. COMPARISON:  None. FINDINGS: Brain: Cerebral volume is within normal limits. 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 No midline shift, ventriculomegaly, mass effect, evidence of mass lesion, intracranial hemorrhage or evidence of cortically based acute infarction. Gray-white matter differentiation is within normal limits throughout the brain. Vascular: No suspicious intracranial vascular hyperdensity. Skull: Negative. Sinuses/Orbits: Visualized paranasal sinuses and mastoids are clear. Other: Negative visible orbit and scalp soft tissues. IMPRESSION: Essentially normal noncontrast head CT; asymmetry of the torcula which appears to be a normal anatomic variation. Electronically Signed   By: Odessa Fleming M.D.   On: 07/22/2019 12:20   Dg Chest Portable 1 View  Result Date: 07/22/2019 CLINICAL DATA:  Worsening headaches over the last 5 days. Dehydration. Reported recent negative COVID-19 testing. EXAM: PORTABLE CHEST 1 VIEW COMPARISON:  None. FINDINGS: 1102 hours. Lordotic positioning. The heart size and mediastinal contours are normal. The lungs are clear. There is no pleural effusion or pneumothorax. No acute osseous findings are identified. IMPRESSION: No active cardiopulmonary process. Electronically Signed   By: Carey Bullocks M.D.   On: 07/22/2019 11:41   US Abdomen Limited Ruq  Result Date: 07/22/2019 CLINICAL DATA:  Fever and elevated LFTs. EXAM: ULTRASOUND ABDOMEN LIMITED RIGHT UPPER QUADRANT COMPARISON:  None. FINDINGS: Gallbladder: Physiologically distended. No gallstones or wall thickening visualized. No sonographic Murphy sign noted by sonographer. Common bile duct: Diameter: 5 mm. Liver: No focal  lesion identified. Heterogeneously increased in parenchymal echogenicity. Portal vein is patent on color Doppler imaging with normal direction of blood flow towards the liver. Other: Technically limited exam due to body habitus. IMPRESSION: 1. Hepatic steatosis without evidence of focal lesion. 2. Normal sonographic appearance of the gallbladder and biliary tree. Electronically Signed   By: Narda Rutherford M.D.   On:  07/22/2019 14:29    Pending Labs Unresulted Labs (From admission, onward)    Start     Ordered   07/22/19 1652  Hepatitis panel, acute  Once,   STAT     07/22/19 1651   07/22/19 1652  RPR  Once,   STAT     07/22/19 1651   07/22/19 1628  Lactic acid, plasma  Now then every 2 hours,   STAT     07/22/19 1627   07/22/19 1627  CSF cell count with differential collection tube #: 1  (Meningitis Panel)  Once,   STAT    Question:  collection tube #  Answer:  1   07/22/19 1626   07/22/19 1627  CSF cell count with differential collection tube #: 4  (Meningitis Panel)  Once,   STAT    Question:  collection tube #  Answer:  4   07/22/19 1626   07/22/19 1627  CSF culture  (Meningitis Panel)  Once,   STAT    Question:  Are there also cytology or pathology orders on this specimen?  Answer:  No   07/22/19 1626   07/22/19 1627  Gram stain  (Meningitis Panel)  ONCE - STAT,   STAT     07/22/19 1626   07/22/19 1627  Protein and glucose, CSF  (Meningitis Panel)  Once,   STAT     07/22/19 1626   07/22/19 1627  Culture, blood (routine x 2)  (Meningitis Panel)  BLOOD CULTURE X 2,   STAT     07/22/19 1626   07/22/19 1627  Herpes simplex virus (HSV), DNA by PCR Cerebrospinal Fluid  (Meningitis Panel)  Once,   STAT     07/22/19 1626   07/22/19 1437  Urine culture  Once,   STAT     07/22/19 1436   Signed and Held  HIV antibody (Routine Testing)  Once,   R     Signed and Held   Signed and Held  CBC  Tomorrow morning,   R     Signed and Held   Signed and Held  Basic metabolic panel  Tomorrow  morning,   R     Signed and Held          Vitals/Pain Today's Vitals   07/22/19 1615 07/22/19 1630 07/22/19 1730 07/22/19 1739  BP: (!) 168/73 (!) 149/82 (!) 110/93   Pulse: (!) 121 (!) 118 (!) 125   Resp:      Temp:      TempSrc:      SpO2: 98% 100% 98%   Weight:      Height:      PainSc:    5     Isolation Precautions Droplet precaution  Medications Medications  ampicillin (OMNIPEN) 2 g in sodium chloride 0.9 % 100 mL IVPB (has no administration in time range)  cefTRIAXone (ROCEPHIN) 2 g in sodium chloride 0.9 % 100 mL IVPB (2 g Intravenous New Bag/Given 07/22/19 1721)  vancomycin (VANCOCIN) 2,500 mg in sodium chloride 0.9 % 500 mL IVPB (has no administration in time range)  acyclovir (ZOVIRAX) 1,540 mg in dextrose 5 % 250 mL IVPB (1,540 mg Intravenous New Bag/Given 07/22/19 1724)  cefTRIAXone (ROCEPHIN) 2 g in sodium chloride 0.9 % 100 mL IVPB (has no administration in time range)  ondansetron (ZOFRAN) injection 4 mg (4 mg Intravenous Given 07/22/19 1137)  sodium chloride 0.9 % bolus 1,000 mL (1,000 mLs Intravenous New Bag/Given 07/22/19 1128)  metoCLOPramide (REGLAN) injection 10 mg (  10 mg Intravenous Given 07/22/19 1133)  diphenhydrAMINE (BENADRYL) injection 50 mg (50 mg Intravenous Given 07/22/19 1131)  ketorolac (TORADOL) 30 MG/ML injection 15 mg (15 mg Intravenous Given 07/22/19 1133)  alum & mag hydroxide-simeth (MAALOX/MYLANTA) 200-200-20 MG/5ML suspension 30 mL (30 mLs Oral Given 07/22/19 1123)  HYDROmorphone (DILAUDID) injection 1 mg (1 mg Intravenous Given 07/22/19 1702)  dexamethasone (DECADRON) injection 10 mg (10 mg Intravenous Given 07/22/19 1719)    Mobility walks Low fall risk   Focused Assessments n/a   R Recommendations: See Admitting Provider Note  Report given to:   Additional Notes: n/a

## 2019-07-22 NOTE — ED Provider Notes (Signed)
[facility] Emergency Department Provider Note  ____________________________________________  Time seen: Approximately 1:48 PM  I have reviewed the triage vital signs and the nursing notes.   HISTORY  Chief Complaint Headache and Gastroesophageal Reflux    HPI Kenneth Craig is a 28 y.o. male with no significant past medical history who complains of generalized headache for the past 5 days associated with subjective fevers and chills and body aches.  Last took NSAIDs about 12 hours ago.  Had a COVID test at [facility] 4 days ago which was reported as negative.  Patient states that he was told to swab himself, but was not given good instructions and did not swab deeply.  Denies chest pain shortness of breath belly pain or diarrhea.  He does have some vomiting and feels like his GERD symptoms are worse than usual, worse when lying down.    History reviewed. No pertinent past medical history.   There are no active problems to display for this patient.    History reviewed. No pertinent surgical history.   Prior to Admission medications   Medication Sig Start Date End Date Taking? Authorizing Provider  ondansetron (ZOFRAN ODT) 4 MG disintegrating tablet Take 1 tablet (4 mg total) by mouth every 8 (eight) hours as needed. 07/21/19   Jene EveryKinner, Robert, MD     Allergies Patient has no known allergies.   No family history on file.  Social History Social History   Tobacco Use  . Smoking status: Never Smoker  . Smokeless tobacco: Never Used  Substance Use Topics  . Alcohol use: No  . Drug use: No    Review of Systems  Constitutional: Positive fever and chills.  ENT:   No sore throat. No rhinorrhea. Cardiovascular:   No chest pain or syncope. Respiratory:   No dyspnea or cough. Gastrointestinal:   Negative for abdominal pain, vomiting and diarrhea.  Musculoskeletal:   Negative for focal pain or swelling All other systems reviewed and are negative except  as documented above in ROS and HPI.  ____________________________________________   PHYSICAL EXAM:  VITAL SIGNS: ED Triage Vitals  Enc Vitals Group     BP 07/22/19 1052 138/73     Pulse Rate 07/22/19 1052 (!) 109     Resp 07/22/19 1052 16     Temp 07/22/19 1052 99 F (37.2 C)     Temp Source 07/22/19 1052 Oral     SpO2 07/22/19 1052 95 %     Weight 07/22/19 1053 (!) 340 lb (154.2 kg)     Height 07/22/19 1053 5\' 9"  (1.753 m)     Head Circumference --      Peak Flow --      Pain Score 07/22/19 1052 8     Pain Loc --      Pain Edu? --      Excl. in GC? --     Vital signs reviewed, nursing assessments reviewed.   Constitutional:   Alert and oriented. Non-toxic appearance.  Morbidly obese Eyes:   Conjunctivae are normal. EOMI. PERRL. ENT      Head:   Normocephalic and atraumatic.      Nose:   No congestion/rhinnorhea.       Mouth/Throat:   MMM, no pharyngeal erythema. No peritonsillar mass.       Neck:   No meningismus. Full ROM without stiffness. Hematological/Lymphatic/Immunilogical:   No cervical lymphadenopathy. Cardiovascular:   Tachycardia heart rate 110. Symmetric bilateral radial and DP pulses.  No murmurs. Cap refill  less than 2 seconds. Respiratory:   Normal respiratory effort without tachypnea/retractions. Breath sounds are clear and equal bilaterally. No wheezes/rales/rhonchi. Gastrointestinal:   Soft and nontender. Non distended. There is no CVA tenderness.  No rebound, rigidity, or guarding.  Musculoskeletal:   Normal range of motion in all extremities. No joint effusions.  No lower extremity tenderness.  No edema. Neurologic:   Normal speech and language.  Motor grossly intact. No acute focal neurologic deficits are appreciated.  Skin:    Skin is warm, dry and intact. No rash noted.  No petechiae, purpura, or bullae.  ____________________________________________    LABS (pertinent positives/negatives) (all labs ordered are listed, but only abnormal  results are displayed) Labs Reviewed  CBC WITH DIFFERENTIAL/PLATELET - Abnormal; Notable for the following components:      Result Value   WBC 15.4 (*)    Neutro Abs 13.3 (*)    Abs Immature Granulocytes 0.15 (*)    All other components within normal limits  COMPREHENSIVE METABOLIC PANEL - Abnormal; Notable for the following components:   Chloride 96 (*)    Glucose, Bld 128 (*)    Creatinine, Ser 1.39 (*)    Total Protein 8.3 (*)    AST 46 (*)    ALT 148 (*)    Total Bilirubin 1.3 (*)    All other components within normal limits  URINALYSIS, COMPLETE (UACMP) WITH MICROSCOPIC - Abnormal; Notable for the following components:   Color, Urine AMBER (*)    APPearance HAZY (*)    Ketones, ur 20 (*)    Protein, ur 30 (*)    All other components within normal limits  SARS CORONAVIRUS 2 (HOSPITAL ORDER, PERFORMED IN [facility])  URINE CULTURE  CSF CULTURE  GRAM STAIN  CULTURE, BLOOD (ROUTINE X 2)  CULTURE, BLOOD (ROUTINE X 2)  LIPASE, BLOOD  CSF CELL COUNT WITH DIFFERENTIAL  CSF CELL COUNT WITH DIFFERENTIAL  PROTEIN AND GLUCOSE, CSF  HERPES SIMPLEX VIRUS(HSV) DNA BY PCR  LACTIC ACID, PLASMA  LACTIC ACID, PLASMA   ____________________________________________   EKG    ____________________________________________    RADIOLOGY  Ct Head Wo Contrast  Result Date: 07/22/2019 CLINICAL DATA:  28 year old male with headache for 5 days. Vomiting last night. Reportedly COVID-19 negative recently. EXAM: CT HEAD WITHOUT CONTRAST TECHNIQUE: Contiguous axial images were obtained from the base of the skull through the vertex without intravenous contrast. COMPARISON:  None. FINDINGS: Brain: Cerebral volume is within normal limits. 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 No midline shift, ventriculomegaly, mass effect, evidence of mass lesion, intracranial hemorrhage or  evidence of cortically based acute infarction. Gray-white matter differentiation is within normal limits throughout the brain. Vascular: No suspicious intracranial vascular hyperdensity. Skull: Negative. Sinuses/Orbits: Visualized paranasal sinuses and mastoids are clear. Other: Negative visible orbit and scalp soft tissues. IMPRESSION: Essentially normal noncontrast head CT; asymmetry of the torcula which appears to be a normal anatomic variation. Electronically Signed   By: Odessa FlemingH  Hall M.D.   On: 07/22/2019 12:20   Dg Chest Portable 1 View  Result Date: 07/22/2019 CLINICAL DATA:  Worsening headaches over the last 5 days. Dehydration. Reported recent negative COVID-19 testing. EXAM: PORTABLE CHEST 1 VIEW COMPARISON:  None. FINDINGS: 1102 hours. Lordotic positioning. The heart size and mediastinal contours are normal. The lungs are clear. There is no pleural effusion or pneumothorax. No acute osseous findings are identified. IMPRESSION:  No active cardiopulmonary process. Electronically Signed   By: Richardean Sale M.D.   On: 07/22/2019 11:41   US Abdomen Limited Ruq  Result Date: 07/22/2019 CLINICAL DATA:  Fever and elevated LFTs. EXAM: ULTRASOUND ABDOMEN LIMITED RIGHT UPPER QUADRANT COMPARISON:  None. FINDINGS: Gallbladder: Physiologically distended. No gallstones or wall thickening visualized. No sonographic Murphy sign noted by sonographer. Common bile duct: Diameter: 5 mm. Liver: No focal lesion identified. Heterogeneously increased in parenchymal echogenicity. Portal vein is patent on color Doppler imaging with normal direction of blood flow towards the liver. Other: Technically limited exam due to body habitus. IMPRESSION: 1. Hepatic steatosis without evidence of focal lesion. 2. Normal sonographic appearance of the gallbladder and biliary tree. Electronically Signed   By: Keith Rake M.D.   On: 07/22/2019 14:29    ____________________________________________   PROCEDURES .Critical  Care Performed by: Carrie Mew, MD Authorized by: Carrie Mew, MD   Critical care provider statement:    Critical care time (minutes):  30   Critical care time was exclusive of:  Separately billable procedures and treating other patients   Critical care was necessary to treat or prevent imminent or life-threatening deterioration of the following conditions:  Sepsis   Critical care was time spent personally by me on the following activities:  Development of treatment plan with patient or surrogate, discussions with consultants, evaluation of patient's response to treatment, examination of patient, obtaining history from patient or surrogate, ordering and performing treatments and interventions, ordering and review of laboratory studies, ordering and review of radiographic studies, pulse oximetry, re-evaluation of patient's condition and review of old charts    ____________________________________________  DIFFERENTIAL DIAGNOSIS   Pneumonia, urinary tract infection, viral syndrome, COVID-19, gastritis, pancreatitis, biliary disease  CLINICAL IMPRESSION / ASSESSMENT AND PLAN / ED COURSE  Medications ordered in the ED: Medications  HYDROmorphone (DILAUDID) injection 1 mg (has no administration in time range)  dexamethasone (DECADRON) injection 10 mg (has no administration in time range)  ampicillin (OMNIPEN) 2 g in sodium chloride 0.9 % 100 mL IVPB (has no administration in time range)  cefTRIAXone (ROCEPHIN) 2 g in sodium chloride 0.9 % 100 mL IVPB (has no administration in time range)  vancomycin (VANCOCIN) 2,500 mg in sodium chloride 0.9 % 500 mL IVPB (has no administration in time range)  acyclovir (ZOVIRAX) 1,540 mg in dextrose 5 % 250 mL IVPB (has no administration in time range)  ondansetron (ZOFRAN) injection 4 mg (4 mg Intravenous Given 07/22/19 1137)  sodium chloride 0.9 % bolus 1,000 mL (1,000 mLs Intravenous New Bag/Given 07/22/19 1128)  metoCLOPramide (REGLAN)  injection 10 mg (10 mg Intravenous Given 07/22/19 1133)  diphenhydrAMINE (BENADRYL) injection 50 mg (50 mg Intravenous Given 07/22/19 1131)  ketorolac (TORADOL) 30 MG/ML injection 15 mg (15 mg Intravenous Given 07/22/19 1133)  alum & mag hydroxide-simeth (MAALOX/MYLANTA) 200-200-20 MG/5ML suspension 30 mL (30 mLs Oral Given 07/22/19 1123)    Pertinent labs & imaging results that were available during my care of the patient were reviewed by me and considered in my medical decision making (see chart for details).  Kenneth Craig was evaluated in Emergency Department on 07/22/2019 for the symptoms described in the history of present illness. He was evaluated in the context of the global COVID-19 pandemic, which necessitated consideration that the patient might be at risk for infection with the SARS-CoV-2 virus that causes COVID-19. Institutional protocols and algorithms that pertain to the evaluation of patients at risk for COVID-19 are in a state of rapid change  based on information released by regulatory bodies including the CDC and federal and state organizations. These policies and algorithms were followed during the patient's care in the ED.   Patient presents with tachycardia, subjective fevers and chills.  Not septic.  I give IV fluids for hydration.  I doubt meningitis encephalitis vascular occlusion or dissection, bowel perforation obstruction or appendicitis.  CT head due to his headache, although it is not thunderclap in unlikely to be related to intracranial hemorrhage, stroke, cerebral aneurysm.  ----------------------------------------- 1:51 PM on 07/22/2019 -----------------------------------------  Labs show elevated LFTs and leukocytosis.  To get an ultrasound of the right upper quadrant.  COVID negative.  Chest x-ray and CT head also unremarkable.  Clinical Course as of Jul 22 1627  Wed Jul 22, 2019  1435 Work-up entirely negative.  Ultrasound unremarkable without signs of gallstones.   Chest x-ray unremarkable CT head unremarkable.  Highly doubt meningitis or encephalitis.  Not febrile in the ED.  We will plan to check urinalysis and otherwise patient is stable for discharge home and outpatient follow-up.   [PS]    Clinical Course User Index [PS] Sharman CheekStafford, Baron Parmelee, MD     ----------------------------------------- 4:27 PM on 07/22/2019 -----------------------------------------  Work-up overall reassuring including chest x-ray CT head ultrasound of the right upper quadrant.  LFTs are elevated and he has a leukocytosis of 15,000.  On reassessment he is now febrile to 103.2 and still tachycardic to 120.  Discussed with hospitalist for admission of suspected meningitis.  Discussed with interventional radiology Dr. Deanne CofferHassell who will plan to do an LP given the patient's morbid obesity.  Now the patient meets sepsis criteria, ordered lactic and blood cultures, CSF panel, ceftriaxone vancomycin and acyclovir Decadron.  ____________________________________________   FINAL CLINICAL IMPRESSION(S) / ED DIAGNOSES    Final diagnoses:  Bad headache  Sepsis, due to unspecified organism, unspecified whether acute organ dysfunction present [facility](HCC)     ED Discharge Orders    None      Portions of this note were generated with dragon dictation software. Dictation errors may occur despite best attempts at proofreading.   Sharman CheekStafford, Mohamedamin Nifong, MD 07/22/19 [phone]

## 2019-07-22 NOTE — ED Notes (Signed)
Pt given apple juice at this time.

## 2019-07-22 NOTE — Consult Note (Signed)
Pharmacy Antibiotic Note  Kenneth Craig is a 28 y.o. male admitted on 07/22/2019 with meningitis.  Pharmacy has been consulted for Vancomycin dosing. Patient states has been having fevers, headache and neck stiffness for days. Went to [facility] and had a [facility] test that went negative. Being treated empirically with Rocephin and Vancomycin currently.  Plan: Vancomycin 1250 mg IV Q 12 hrs. Goal AUC 400-550. Expected AUC: 455.1 Expected Css: 13.4 SCr used: 1.39   Height: 5\' 9"  (175.3 cm) Weight: (!) 340 lb (154.2 kg) IBW/kg (Calculated) : 70.7  Temp (24hrs), Avg:99.2 F (37.3 C), Min:99 F (37.2 C), Max:99.3 F (37.4 C)  Recent Labs  Lab 07/22/19 1122 07/22/19 1628  WBC 15.4*  --   CREATININE 1.39*  --   LATICACIDVEN  --  1.5    Estimated Creatinine Clearance: 118.6 mL/min (A) (by C-G formula based on SCr of 1.39 mg/dL (H)).    No Known Allergies  Antimicrobials this admission: Vancomycin 7/29 >>  Rocephin 7/29 >>  Acyclovir 7/29 x 1   Microbiology results: 7/29 BCx: pending 7/29 UCx: pending  7/29 CSF cx: pending  Thank you for allowing pharmacy to be a part of this patient's care.  Skylar Priest A Dameer Speiser 07/22/2019 7:01 PM

## 2019-07-22 NOTE — Consult Note (Signed)
NAME: Kenneth CurrentJacob Steinhoff  DOB: 05/28/1991  MRN: [id number]  Date/Time: 07/22/2019 7:19 PM  REQUESTING PROVIDER: Dr. Allena KatzPatel Subjective:  REASON FOR CONSULT: Meningitis ? Kenneth Craig is a 28 y.o. male with no significant past medical history is admitted with fever of 6days duration and headache of 4 days duration .  Patient states he was doing well until Friday when he had gone to work.  He works at [employer].  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  They do not wear mask.  Patient was fine when he went to work and when he returned home started having a fever.  Then he developed headache over the weekend and started taking ibuprofen and Tylenol.  [facility] Sunday, 07/19/2019 self-administered COVID test .  He came to our ED y esterday complaining of fever headache  low back pain.  He also had an episode of nausea and vomiting.  The ED that time his temperature was 100.5, heart rate of 120 andBlood pressure 158/96.  The working diagnosis was COVID-19 and he was asked to wait for the test results.  He was given Zofran and asked to take pain medicine. He came back again to the ED today complaining of worsening headache and fever and also the COVID test came back negative . Today in the ED his blood pressure was 138/73, heart rate of 109, temperature of 99 pulse ox of 95%. WBC was 15.4, creatinine of 1.39, ALT of 148 and AST of 46.  He also was noted to have a rash on his arms and chest and thighs which he was not sure when it started.  I am asked to see the patient for possible meningitis.  Patient is fully awake and alert.  He does not have any,Sore throat or cough or nasal congestion. He has no diarrhea.  He has no penile lesions.  He did not see any attached ticks.  He has no pets he is not an outdoor person he says.  He spent most of her time at home watching TV.  He is got custody of his 26-year-old child who was with him 10 days ago.   He has had a  new sex partner in the past month. In May he he took STD tests had negative test results for GC chlamydia trichomonas, HIV and syphilis.  Past medical history none Past surgical history none  Social history Non-smoker Occasional alcohol Lives on his own Occasional marijuana No  hard drugs He works at [employer]  No family history on file. No Known Allergies  ? Current Facility-Administered Medications  Medication Dose Route Frequency Provider Last Rate Last Dose  . 0.9 %  sodium chloride infusion   Intravenous Continuous Auburn BilberryPatel, Shreyang, MD 125 mL/hr at 07/22/19 1907    . acetaminophen (TYLENOL) tablet 650 mg  650 mg Oral Q6H PRN Auburn BilberryPatel, Shreyang, MD       Or  . acetaminophen (TYLENOL) suppository 650 mg  650 mg Rectal Q6H PRN Auburn BilberryPatel, Shreyang, MD      . Melene Muller[START ON 07/23/2019] cefTRIAXone (ROCEPHIN) 2 g in sodium chloride 0.9 % 100 mL IVPB  2 g Intravenous Q12H Auburn BilberryPatel, Shreyang, MD      . doxycycline (VIBRAMYCIN) 100 mg in sodium chloride 0.9 % 250 mL IVPB  100 mg Intravenous Q12H Auburn BilberryPatel, Shreyang, MD      . HYDROcodone-acetaminophen (NORCO/VICODIN) 5-325 MG per tablet 1-2 tablet  1-2 tablet Oral Q4H PRN Auburn BilberryPatel, Shreyang,  MD      . morphine 2 MG/ML injection 2 mg  2 mg Intravenous Q4H PRN Dustin Flock, MD      . ondansetron [facility]) tablet 4 mg  4 mg Oral Q6H PRN Dustin Flock, MD       Or  . ondansetron [facility]) injection 4 mg  4 mg Intravenous Q6H PRN Dustin Flock, MD      . Derrill Memo ON 07/23/2019] vancomycin (VANCOCIN) 1,250 mg in sodium chloride 0.9 % 250 mL IVPB  1,250 mg Intravenous Q12H Nazari, Walid A, RPH      . vancomycin (VANCOCIN) 2,500 mg in sodium chloride 0.9 % 500 mL IVPB  2,500 mg Intravenous Once Carrie Mew, MD 250 mL/hr at 07/22/19 1908 2,500 mg at 07/22/19 1908     Abtx:  Anti-infectives (From admission, onward)   Start     Dose/Rate Route Frequency Ordered Stop   07/23/19 1000  vancomycin (VANCOCIN) 1,250 mg in sodium chloride 0.9 % 250 mL IVPB     1,250  mg 166.7 mL/hr over 90 Minutes Intravenous Every 12 hours 07/22/19 1856     07/23/19 0530  cefTRIAXone (ROCEPHIN) 2 g in sodium chloride 0.9 % 100 mL IVPB    Note to Pharmacy: Please dose it for meningitis   2 g 200 mL/hr over 30 Minutes Intravenous Every 12 hours 07/22/19 1642     07/22/19 2000  doxycycline (VIBRAMYCIN) 100 mg in sodium chloride 0.9 % 250 mL IVPB     100 mg 125 mL/hr over 120 Minutes Intravenous Every 12 hours 07/22/19 1859     07/22/19 1700  ampicillin (OMNIPEN) 2 g in sodium chloride 0.9 % 100 mL IVPB  Status:  Discontinued     2 g 300 mL/hr over 20 Minutes Intravenous  Once 07/22/19 1619 07/22/19 1845   07/22/19 1700  vancomycin (VANCOCIN) 2,500 mg in sodium chloride 0.9 % 500 mL IVPB     2,500 mg 250 mL/hr over 120 Minutes Intravenous  Once 07/22/19 1619     07/22/19 1700  acyclovir (ZOVIRAX) 1,540 mg in dextrose 5 % 250 mL IVPB     10 mg/kg  154.2 kg 280.8 mL/hr over 60 Minutes Intravenous  Once 07/22/19 1620 07/22/19 1824   07/22/19 1645  cefTRIAXone (ROCEPHIN) 2 g in sodium chloride 0.9 % 100 mL IVPB  Status:  Discontinued     2 g 200 mL/hr over 30 Minutes Intravenous Every 24 hours 07/22/19 1642 07/22/19 1642   07/22/19 1630  cefTRIAXone (ROCEPHIN) 2 g in sodium chloride 0.9 % 100 mL IVPB     2 g 200 mL/hr over 30 Minutes Intravenous  Once 07/22/19 1619 07/22/19 1751      REVIEW OF SYSTEMS:  Const:  fever, negative chills, negative weight loss Eyes: negative diplopia or visual changes, negative eye pain, has some photophobia ENT: negative coryza, negative sore throat Resp: negative cough, hemoptysis, dyspnea Cards: negative for chest pain, palpitations, lower extremity edema GU: negative for frequency, dysuria and hematuria GI: Negative for abdominal pain, diarrhea, bleeding, constipation Skin: negative for rash and pruritus Heme: negative for easy bruising and gum/nose bleeding MS: Has back pain, neck pain and headache Neurolo: Positive for headaches,  no dizziness, vertigo, memory problems  Psych: negative for feelings of anxiety, depression  Endocrine: negative for thyroid, diabetes Allergy/Immunology- negative for any medication or food allergies ?: Objective:  VITALS:  BP (!) 146/67 (BP Location: Right Arm)   Pulse (!) 122   Temp 99.3 F (37.4 C) (Oral)  Resp 19   Ht 5\' 9"  (1.753 m)   Wt (!) 154.2 kg   SpO2 98%   BMI 50.21 kg/m  PHYSICAL EXAM:  General: Alert, cooperative, oriented x5  in distress, morbidly obese.  Head: Normocephalic, without obvious abnormality, atraumatic. Neck stiffness present Eyes: Conjunctivae clear, anicteric sclerae. Pupils are equal ENT Nares normal. No drainage or sinus tenderness. Oral mucosa very dry. No erythema of the pharynx, no sores in his mouth  neck: Tender on movement, symmetrical, no adenopathy, thyroid: non tender no carotid bruit and no JVD. Back: No CVA tenderness. Lungs: Bilateral air entry Heart: S1-S2 Abdomen: Soft, non-tender,not distended. Bowel sounds normal. No masses Extremities: atraumatic, no cyanosis. No edema. No clubbing Skin: Maculopapular rash over his chest and upper arms and on his thighs              lymph: Cervical, supraclavicular normal. Neurologic: Grossly non-focal Pertinent Labs Lab Results CBC    Component Value Date/Time   WBC 15.4 (H) 07/22/2019 1122   RBC 5.44 07/22/2019 1122   HGB 15.0 07/22/2019 1122   HCT 44.8 07/22/2019 1122   PLT 234 07/22/2019 1122   MCV 82.4 07/22/2019 1122   MCH 27.6 07/22/2019 1122   MCHC 33.5 07/22/2019 1122   RDW 12.7 07/22/2019 1122   LYMPHSABS 0.8 07/22/2019 1122   MONOABS 1.0 07/22/2019 1122   EOSABS 0.0 07/22/2019 1122   BASOSABS 0.1 07/22/2019 1122    CMP Latest Ref Rng & Units 07/22/2019  Glucose 70 - 99 mg/dL 161(W128(H)  BUN 6 - 20 mg/dL 16  Creatinine 9.600.61 - 4.541.24 mg/dL 0.98(J1.39(H)  Sodium 191135 - 478145 mmol/L 135  Potassium 3.5 - 5.1 mmol/L 4.2  Chloride 98 - 111 mmol/L 96(L)  CO2 22 - 32  mmol/L 26  Calcium 8.9 - 10.3 mg/dL 9.2  Total Protein 6.5 - 8.1 g/dL 8.3(H)  Total Bilirubin 0.3 - 1.2 mg/dL 2.9(F1.3(H)  Alkaline Phos 38 - 126 U/L 70  AST 15 - 41 U/L 46(H)  ALT 0 - 44 U/L 148(H)      Microbiology: Recent Results (from the past 240 hour(s))  SARS Coronavirus 2 (CEPHEID- Performed in [facility]), Hosp Order     Status: None   Collection Time: 07/22/19 12:13 PM   Specimen: Nasopharyngeal Swab  Result Value Ref Range Status   SARS Coronavirus 2 NEGATIVE NEGATIVE Final    Comment: (NOTE) If result is NEGATIVE SARS-CoV-2 target nucleic acids are NOT DETECTED. The SARS-CoV-2 RNA is generally detectable in upper and lower  respiratory specimens during the acute phase of infection. The lowest  concentration of SARS-CoV-2 viral copies this assay can detect is 250  copies / mL. A negative result does not preclude SARS-CoV-2 infection  and should not be used as the sole basis for treatment or other  patient management decisions.  A negative result may occur with  improper specimen collection / handling, submission of specimen other  than nasopharyngeal swab, presence of viral mutation(s) within the  areas targeted by this assay, and inadequate number of viral copies  (<250 copies / mL). A negative result must be combined with clinical  observations, patient history, and epidemiological information. If result is POSITIVE SARS-CoV-2 target nucleic acids are DETECTED. The SARS-CoV-2 RNA is generally detectable in upper and lower  respiratory specimens dur ing the acute phase of infection.  Positive  results are indicative of active infection with SARS-CoV-2.  Clinical  correlation with patient history and other diagnostic information is  necessary to determine patient infection status.  Positive results do  not rule out bacterial infection or co-infection with other viruses. If result is PRESUMPTIVE POSTIVE SARS-CoV-2 nucleic acids MAY BE PRESENT.   A  presumptive positive result was obtained on the submitted specimen  and confirmed on repeat testing.  While 2019 novel coronavirus  (SARS-CoV-2) nucleic acids may be present in the submitted sample  additional confirmatory testing may be necessary for epidemiological  and / or clinical management purposes  to differentiate between  SARS-CoV-2 and other Sarbecovirus currently known to infect humans.  If clinically indicated additional testing with an alternate test  methodology [phone] is advised. The SARS-CoV-2 RNA is generally  detectable in upper and lower respiratory sp ecimens during the acute  phase of infection. The expected result is Negative. Fact Sheet for Patients:  BoilerBrush.com.cyhttps://www.fda.gov/media/136312/download Fact Sheet for Healthcare Providers: https://pope.com/https://www.fda.gov/media/136313/download This test is not yet approved or cleared by the [place] FDA and has been authorized for detection and/or diagnosis of SARS-CoV-2 by FDA under an Emergency Use Authorization (EUA).  This EUA will remain in effect (meaning this test can be used) for the duration of the COVID-19 declaration under Section 564(b)(1) of the Act, 21 U.S.C. section 360bbb-3(b)(1), unless the authorization is terminated or revoked sooner. Performed at [facility], [address] [place], [place] [address]     IMAGING RESULTS: CT head without contrast normal Ultrasound of the liver and gallbladder shows hepatic steatosis [place] x-ray no active cardiopulmonary process  I have personally reviewed the films ? Impression/Recommendation ?28 year old male presenting with fever, headache, neck pain, rash and minimal GI symptoms.  This been going on for the last 4 to 6 days.  This looks like a viral illness. Very likely an aseptic meningitis.  Doubt this is bacterial meningitis as patient would have been much sicker in the past 6 days. Enterovirus is in the differential diagnosis Coronavirus is  negative Herpes aseptic meningitis is in the differential diagnosis as he has had a new partner in the last month.  But the rash is is not typical for herpes.  Tickborne illness like Ehrlichia or [facility] spotted fever is to be considered especially with the rash, abnormal LFTs fever and headache..  He does have abnormal LFTs which could be due to ibuprofen and Tylenol use in the last 6 days.  Has normal platelet count and high WBC which could be secondary to dehydration.  We will also check for HIV and sent for RPR as well  He needs a lumbar puncture and is waiting for IR procedure He is currently on vancomycin, ceftriaxone, ampicillin and also received a dose of acyclovir.   We will DC vancomycin and ampicillin as doubt that he has Listeria. We will continue acyclovir and ceftriaxone.  Will  doxycycline for the time being  ?AKI likely due to dehydration plus NSAID use.  Getting fluids now. ? _Morbid obesity with ultrasound showing hepatic steatosis.  __________________________________________________ Discussed with patient,and  requesting provider Note:  This document was prepared using Dragon voice recognition software and may include unintentional dictation errors.

## 2019-07-22 NOTE — ED Notes (Signed)
Pt also states new rash appeared on chest, stomach and arms last pm, itches, unsure what caused it.

## 2019-07-23 ENCOUNTER — Inpatient Hospital Stay: Payer: BC Managed Care – PPO

## 2019-07-23 DIAGNOSIS — G039 Meningitis, unspecified: Secondary | ICD-10-CM

## 2019-07-23 LAB — CSF CELL COUNT WITH DIFFERENTIAL
Eosinophils, CSF: 0 %
Lymphs, CSF: 14 %
Monocyte-Macrophage-Spinal Fluid: 5 %
RBC Count, CSF: 379 /mm3 — ABNORMAL HIGH (ref 0–3)
Segmented Neutrophils-CSF: 81 %
Tube #: 3
WBC, CSF: 626 /mm3 (ref 0–5)

## 2019-07-23 LAB — HEPATIC FUNCTION PANEL
ALT: 97 U/L — ABNORMAL HIGH (ref 0–44)
AST: 21 U/L (ref 15–41)
Albumin: 3.3 g/dL — ABNORMAL LOW (ref 3.5–5.0)
Alkaline Phosphatase: 61 U/L (ref 38–126)
Bilirubin, Direct: 0.2 mg/dL (ref 0.0–0.2)
Indirect Bilirubin: 0.6 mg/dL (ref 0.3–0.9)
Total Bilirubin: 0.8 mg/dL (ref 0.3–1.2)
Total Protein: 7.2 g/dL (ref 6.5–8.1)

## 2019-07-23 LAB — BASIC METABOLIC PANEL
Anion gap: 11 (ref 5–15)
BUN: 18 mg/dL (ref 6–20)
CO2: 24 mmol/L (ref 22–32)
Calcium: 8.5 mg/dL — ABNORMAL LOW (ref 8.9–10.3)
Chloride: 101 mmol/L (ref 98–111)
Creatinine, Ser: 1.04 mg/dL (ref 0.61–1.24)
GFR calc Af Amer: >60 mL/min (ref >60)
GFR calc non Af Amer: >60 mL/min (ref >60)
Glucose, Bld: 125 mg/dL — ABNORMAL HIGH (ref 70–99)
Potassium: 4.4 mmol/L (ref 3.5–5.1)
Sodium: 136 mmol/L (ref 135–145)

## 2019-07-23 LAB — URINE CULTURE: Culture: <10000 — AB

## 2019-07-23 LAB — CBC
HCT: 41.3 % (ref 39.0–52.0)
Hemoglobin: 13.4 g/dL (ref 13.0–17.0)
MCH: 27.4 pg (ref 26.0–34.0)
MCHC: 32.4 g/dL (ref 30.0–36.0)
MCV: 84.5 fL (ref 80.0–100.0)
Platelets: 284 10*3/uL (ref 150–400)
RBC: 4.89 MIL/uL (ref 4.22–5.81)
RDW: 13.1 % (ref 11.5–15.5)
WBC: 16.2 10*3/uL — ABNORMAL HIGH (ref 4.0–10.5)
nRBC: 0 % (ref 0.0–0.2)

## 2019-07-23 LAB — PROTEIN AND GLUCOSE, CSF
Glucose, CSF: 64 mg/dL (ref 40–70)
Total  Protein, CSF: 57 mg/dL — ABNORMAL HIGH (ref 15–45)

## 2019-07-23 LAB — PROTIME-INR
INR: 1 (ref 0.8–1.2)
Prothrombin Time: 13.2 seconds (ref 11.4–15.2)

## 2019-07-23 LAB — APTT: aPTT: 35 seconds (ref 24–36)

## 2019-07-23 LAB — STREP PNEUMONIAE URINARY ANTIGEN: Strep Pneumo Urinary Antigen: NEGATIVE

## 2019-07-23 LAB — PROCALCITONIN: Procalcitonin: 0.65 ng/mL

## 2019-07-23 MED ORDER — DEXTROSE 5 % IV SOLN
1000.0000 mg | Freq: Three times a day (TID) | INTRAVENOUS | Status: DC
  Administered 2019-07-23 – 2019-07-24 (×5): 1000 mg via INTRAVENOUS
  Filled 2019-07-23 (×9): qty 20

## 2019-07-23 MED ORDER — SODIUM CHLORIDE 0.9 % IV SOLN
100.0000 mg | Freq: Two times a day (BID) | INTRAVENOUS | Status: DC
  Administered 2019-07-23 – 2019-07-24 (×2): 100 mg via INTRAVENOUS
  Filled 2019-07-23 (×4): qty 100

## 2019-07-23 NOTE — Progress Notes (Signed)
ID Had LP this afternoon Lying flat in bed which is not comfortable for him- says his neck hurts. No fever since last night  Patient Vitals for the past 24 hrs:  BP Temp Temp src Pulse Resp SpO2  07/23/19 1307 (!) 144/87 98.8 F (37.1 C) Oral 91 20 100 %  07/22/19 2210 - 99.5 F (37.5 C) Oral - - -  07/22/19 2012 (!) 159/52 (!) 101.5 F (38.6 C) Oral (!) 114 18 98 %   Somnolent  On waking him up fully oriented and responds appropriately to questions. Rash on his arms is fading on his thighs. Chest b/l air entry  LAbs CBC Latest Ref Rng & Units 07/23/2019 07/22/2019  WBC 4.0 - 10.5 K/uL 16.2(H) 15.4(H)  Hemoglobin 13.0 - 17.0 g/dL 13.4 15.0  Hematocrit 39.0 - 52.0 % 41.3 44.8  Platelets 150 - 400 K/uL 284 234   Csf-  WBC 898 ( 89%) N protein 57, no RBC    Impression/Recommendation  Meningitis- this still looks like viral meningitis  enterovirus especially with maculopapular rash  And it  can cause neutrophilic pleocytosis.  Bacterial meningitis less likely but will continue ceftriaxone until results are available  Herpes meningitis is les slilely as well- currently on acyclovir , may DC tomorrow  On Doxy for possible Tick related illness- RMSF more than Lyme but doubt it as no exposure  Discussed the management with the patient and his nurse

## 2019-07-23 NOTE — Progress Notes (Signed)
[facility] [place] [facility]   PATIENT NAME: Kenneth Craig    MR#:  [id number]  DATE OF BIRTH:  09/13/1991  SUBJECTIVE:  CHIEF COMPLAINT: Patient is feeling slightly better today denies any headache or blurry vision.  Neck stiffness is improving.  Tolerating diet and denies any vomiting.  Rash on his body is getting better  REVIEW OF SYSTEMS:  CONSTITUTIONAL: No fever, fatigue or weakness.  EYES: No blurred or double vision.  EARS, NOSE, AND THROAT: No tinnitus or ear pain.  RESPIRATORY: No cough, shortness of breath, wheezing or hemoptysis.  CARDIOVASCULAR: No chest pain, orthopnea, edema.  GASTROINTESTINAL: No nausea, vomiting, diarrhea or abdominal pain.  GENITOURINARY: No dysuria, hematuria.  ENDOCRINE: No polyuria, nocturia,  HEMATOLOGY: No anemia, easy bruising or bleeding SKIN: No rash or lesion. MUSCULOSKELETAL: No joint pain or arthritis.   NEUROLOGIC: No tingling, numbness, weakness.  PSYCHIATRY: No anxiety or depression.   DRUG ALLERGIES:  No Known Allergies  VITALS:  Blood pressure (!) 144/87, pulse 91, temperature 98.8 F (37.1 C), temperature source Oral, resp. rate 20, height 5\' 9"  (1.753 m), weight (!) 154.2 kg, SpO2 100 %.  PHYSICAL EXAMINATION:  GENERAL:  28 y.o.-year-old patient lying in the bed with no acute distress.  EYES: Pupils equal, round, reactive to light and accommodation. No scleral icterus. Extraocular muscles intact.  HEENT: Head atraumatic, normocephalic. Oropharynx and nasopharynx clear.  NECK:  Supple, no jugular venous distention. No thyroid enlargement, no tenderness.  No neck stiffness.   LUNGS: Normal breath sounds bilaterally, no wheezing, rales,rhonchi or crepitation. No use of accessory muscles of respiration.  CARDIOVASCULAR: S1, S2 normal. No murmurs, rubs, or gallops.  ABDOMEN: Soft, nontender, nondistended. Bowel sounds present.   EXTREMITIES: No pedal edema, cyanosis, or clubbing.  NEUROLOGIC:  Cranial nerves II through XII are intact. Muscle strength 5/5 in all extremities. Sensation intact. Gait not checked.  PSYCHIATRIC: The patient is alert and oriented x 3.  SKIN: Positive maculopapular rash           LABORATORY PANEL:   CBC Recent Labs  Lab 07/23/19 0529  WBC 16.2*  HGB 13.4  HCT 41.3  PLT 284   ------------------------------------------------------------------------------------------------------------------  Chemistries  Recent Labs  Lab 07/22/19 1122 07/23/19 0529  NA 135 136  K 4.2 4.4  CL 96* 101  CO2 26 24  GLUCOSE 128* 125*  BUN 16 18  CREATININE 1.39* 1.04  CALCIUM 9.2 8.5*  AST 46*  --   ALT 148*  --   ALKPHOS 70  --   BILITOT 1.3*  --    ------------------------------------------------------------------------------------------------------------------  Cardiac Enzymes No results for input(s): TROPONINI in the last 168 hours. ------------------------------------------------------------------------------------------------------------------  RADIOLOGY:  Ct Head Wo Contrast  Result Date: 07/22/2019 CLINICAL DATA:  28 year old male with headache for 5 days. Vomiting last night. Reportedly COVID-19 negative recently. EXAM: CT HEAD WITHOUT CONTRAST TECHNIQUE: Contiguous axial images were obtained from the base of the skull through the vertex without intravenous contrast. COMPARISON:  None. FINDINGS: Brain: Cerebral volume is within normal limits. 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 No midline shift, ventriculomegaly, mass effect, evidence of mass lesion, intracranial hemorrhage or evidence of cortically based acute infarction. Gray-white matter differentiation is within normal limits throughout the brain. Vascular: No suspicious intracranial vascular hyperdensity. Skull: Negative. Sinuses/Orbits: Visualized paranasal sinuses and mastoids  are clear. Other: Negative visible orbit and scalp soft  tissues. IMPRESSION: Essentially normal noncontrast head CT; asymmetry of the torcula which appears to be a normal anatomic variation. Electronically Signed   By: Genevie Ann M.D.   On: 07/22/2019 12:20   Dg Chest Portable 1 View  Result Date: 07/22/2019 CLINICAL DATA:  Worsening headaches over the last 5 days. Dehydration. Reported recent negative COVID-19 testing. EXAM: PORTABLE CHEST 1 VIEW COMPARISON:  None. FINDINGS: 1102 hours. Lordotic positioning. The heart size and mediastinal contours are normal. The lungs are clear. There is no pleural effusion or pneumothorax. No acute osseous findings are identified. IMPRESSION: No active cardiopulmonary process. Electronically Signed   By: Richardean Sale M.D.   On: 07/22/2019 11:41   US Abdomen Limited Ruq  Result Date: 07/22/2019 CLINICAL DATA:  Fever and elevated LFTs. EXAM: ULTRASOUND ABDOMEN LIMITED RIGHT UPPER QUADRANT COMPARISON:  None. FINDINGS: Gallbladder: Physiologically distended. No gallstones or wall thickening visualized. No sonographic Murphy sign noted by sonographer. Common bile duct: Diameter: 5 mm. Liver: No focal lesion identified. Heterogeneously increased in parenchymal echogenicity. Portal vein is patent on color Doppler imaging with normal direction of blood flow towards the liver. Other: Technically limited exam due to body habitus. IMPRESSION: 1. Hepatic steatosis without evidence of focal lesion. 2. Normal sonographic appearance of the gallbladder and biliary tree. Electronically Signed   By: Keith Rake M.D.   On: 07/22/2019 14:29    EKG:  No orders found for this or any previous visit.  ASSESSMENT AND PLAN:     Patient is 28 year old white male presenting with neck pain and fever.  1.  Neck pain and fever's highly suspicion for meningitis  Patient is clinically feeling better  We will continue Rocephin Vanco and ampicillin.  Acyclovir for herpes coverage  Seen  by infectious disease  LP and CSF labs are ordered to be done by interventional radiology Patient has received a dose of IV Decadron I will hold off on this unless ID physician feels that these are needed Procalcitonin 0.65 and WBC 16.2 Blood cultures with no growth so far.  Febrile last night but no fever today  2.  Acute kidney injury from dehydration Clinically improving  creatinine 1.39-1.04   3.  Elevated LFTs I will check a hepatitis panel.  Repeat LFTs  4.    Rash with  elevated LFTs check Lyme titers, [facility] spotted fever Liver ultrasound with the hepatic steatosis  SCDs for DVT prophylaxis   All the records are reviewed and case discussed with Care Management/Social Workerr. Management plans discussed with the patient, mom Ray- [phone]  and they are in agreement.  CODE STATUS: FC   TOTAL TIME TAKING CARE OF THIS PATIENT: 36 minutes.   POSSIBLE D/C IN 1-2 DAYS, DEPENDING ON CLINICAL CONDITION.  Note: This dictation was prepared with Dragon dictation along with smaller phrase technology. Any transcriptional errors that result from this process are unintentional.   Nicholes Mango M.D on 07/23/2019 at 1:22 PM  Between 7am to 6pm - Pager - [phone] After 6pm go to [email or web address] - password [email or web address] [facility]  Office  [phone]  CC: Primary care physician; Juline Patch, MD

## 2019-07-24 DIAGNOSIS — D72829 Elevated white blood cell count, unspecified: Secondary | ICD-10-CM

## 2019-07-24 LAB — CSF CELL COUNT WITH DIFFERENTIAL
Eosinophils, CSF: 0 %
Lymphs, CSF: 3 %
Monocyte-Macrophage-Spinal Fluid: 8 %
RBC Count, CSF: 0 /mm3 (ref 0–3)
Segmented Neutrophils-CSF: 89 %
Tube #: 1
WBC, CSF: 898 /mm3 (ref 0–5)

## 2019-07-24 LAB — CBC WITH DIFFERENTIAL/PLATELET
Abs Immature Granulocytes: 0.63 10*3/uL — ABNORMAL HIGH (ref 0.00–0.07)
Basophils Absolute: 0.1 10*3/uL (ref 0.0–0.1)
Basophils Relative: 1 %
Eosinophils Absolute: 0.1 10*3/uL (ref 0.0–0.5)
Eosinophils Relative: 1 %
HCT: 40.7 % (ref 39.0–52.0)
Hemoglobin: 13.4 g/dL (ref 13.0–17.0)
Immature Granulocytes: 3 %
Lymphocytes Relative: 8 %
Lymphs Abs: 1.7 10*3/uL (ref 0.7–4.0)
MCH: 27.7 pg (ref 26.0–34.0)
MCHC: 32.9 g/dL (ref 30.0–36.0)
MCV: 84.1 fL (ref 80.0–100.0)
Monocytes Absolute: 1.5 10*3/uL — ABNORMAL HIGH (ref 0.1–1.0)
Monocytes Relative: 8 %
Neutro Abs: 15.7 10*3/uL — ABNORMAL HIGH (ref 1.7–7.7)
Neutrophils Relative %: 79 %
Platelets: 341 10*3/uL (ref 150–400)
RBC: 4.84 MIL/uL (ref 4.22–5.81)
RDW: 13.1 % (ref 11.5–15.5)
WBC: 19.8 10*3/uL — ABNORMAL HIGH (ref 4.0–10.5)
nRBC: 0 % (ref 0.0–0.2)

## 2019-07-24 LAB — COMPREHENSIVE METABOLIC PANEL
ALT: 76 U/L — ABNORMAL HIGH (ref 0–44)
AST: 29 U/L (ref 15–41)
Albumin: 3.2 g/dL — ABNORMAL LOW (ref 3.5–5.0)
Alkaline Phosphatase: 58 U/L (ref 38–126)
Anion gap: 12 (ref 5–15)
BUN: 17 mg/dL (ref 6–20)
CO2: 24 mmol/L (ref 22–32)
Calcium: 8.5 mg/dL — ABNORMAL LOW (ref 8.9–10.3)
Chloride: 99 mmol/L (ref 98–111)
Creatinine, Ser: 1.14 mg/dL (ref 0.61–1.24)
GFR calc Af Amer: >60 mL/min (ref >60)
GFR calc non Af Amer: >60 mL/min (ref >60)
Glucose, Bld: 125 mg/dL — ABNORMAL HIGH (ref 70–99)
Potassium: 4 mmol/L (ref 3.5–5.1)
Sodium: 135 mmol/L (ref 135–145)
Total Bilirubin: 0.8 mg/dL (ref 0.3–1.2)
Total Protein: 7.2 g/dL (ref 6.5–8.1)

## 2019-07-24 LAB — HIV ANTIBODY (ROUTINE TESTING W REFLEX): HIV Screen 4th Generation wRfx: NONREACTIVE

## 2019-07-24 LAB — HEPATITIS B SURFACE ANTIGEN: Hepatitis B Surface Ag: NEGATIVE

## 2019-07-24 LAB — HSV DNA BY PCR (REFERENCE LAB)
HSV 1 DNA: NEGATIVE
HSV 2 DNA: NEGATIVE

## 2019-07-24 LAB — RPR: RPR Ser Ql: NONREACTIVE

## 2019-07-24 LAB — HEPATITIS PANEL, ACUTE
HCV Ab: <0.1 s/co ratio (ref 0.0–0.9)
Hep A IgM: NEGATIVE
Hep B C IgM: NEGATIVE
Hepatitis B Surface Ag: NEGATIVE

## 2019-07-24 LAB — HEPATITIS A ANTIBODY, IGM: Hep A IgM: NEGATIVE

## 2019-07-24 LAB — PROCALCITONIN: Procalcitonin: 0.32 ng/mL

## 2019-07-24 LAB — PATHOLOGIST SMEAR REVIEW

## 2019-07-24 LAB — HEPATITIS C ANTIBODY: HCV Ab: 0.1 s/co ratio (ref 0.0–0.9)

## 2019-07-24 NOTE — Progress Notes (Signed)
[facility] [place] [facility]   PATIENT NAME: Kenneth Craig    MR#:  [id number]  DATE OF BIRTH:  03/16/1991  SUBJECTIVE:  CHIEF COMPLAINT: Patient is feeling weak today neck hurts when he moves   febrile today a.m.  REVIEW OF SYSTEMS:  CONSTITUTIONAL: No fever, fatigue or weakness.  EYES: No blurred or double vision.  EARS, NOSE, AND THROAT: No tinnitus or ear pain.  RESPIRATORY: No cough, shortness of breath, wheezing or hemoptysis.  CARDIOVASCULAR: No chest pain, orthopnea, edema.  GASTROINTESTINAL: No nausea, vomiting, diarrhea or abdominal pain.  GENITOURINARY: No dysuria, hematuria.  ENDOCRINE: No polyuria, nocturia,  HEMATOLOGY: No anemia, easy bruising or bleeding SKIN: No rash or lesion. MUSCULOSKELETAL: No joint pain or arthritis.   NEUROLOGIC: No tingling, numbness, weakness.  PSYCHIATRY: No anxiety or depression.   DRUG ALLERGIES:  No Known Allergies  VITALS:  Blood pressure (!) 151/96, pulse 91, temperature 98.6 F (37 C), temperature source Oral, resp. rate 20, height 5\' 9"  (1.753 m), weight (!) 154.2 kg, SpO2 97 %.  PHYSICAL EXAMINATION:  GENERAL:  28 y.o.-year-old patient lying in the bed with no acute distress.  EYES: Pupils equal, round, reactive to light and accommodation. No scleral icterus. Extraocular muscles intact.  HEENT: Head atraumatic, normocephalic. Oropharynx and nasopharynx clear.  NECK:  Supple, no jugular venous distention. No thyroid enlargement, no tenderness.  No neck stiffness.   LUNGS: Normal breath sounds bilaterally, no wheezing, rales,rhonchi or crepitation. No use of accessory muscles of respiration.  CARDIOVASCULAR: S1, S2 normal. No murmurs, rubs, or gallops.  ABDOMEN: Soft, nontender, nondistended. Bowel sounds present.   EXTREMITIES: No pedal edema, cyanosis, or clubbing.  NEUROLOGIC: Cranial nerves II through XII are intact. Muscle strength 5/5 in all extremities. Sensation intact. Gait not checked.   PSYCHIATRIC: The patient is alert and oriented x 3.  SKIN: Positive maculopapular rash           LABORATORY PANEL:   CBC Recent Labs  Lab 07/24/19 0442  WBC 19.8*  HGB 13.4  HCT 40.7  PLT 341   ------------------------------------------------------------------------------------------------------------------  Chemistries  Recent Labs  Lab 07/24/19 0442  NA 135  K 4.0  CL 99  CO2 24  GLUCOSE 125*  BUN 17  CREATININE 1.14  CALCIUM 8.5*  AST 29  ALT 76*  ALKPHOS 58  BILITOT 0.8   ------------------------------------------------------------------------------------------------------------------  Cardiac Enzymes No results for input(s): TROPONINI in the last 168 hours. ------------------------------------------------------------------------------------------------------------------  RADIOLOGY:  Ct Head Wo Contrast  Result Date: 07/22/2019 CLINICAL DATA:  28 year old male with headache for 5 days. Vomiting last night. Reportedly COVID-19 negative recently. EXAM: CT HEAD WITHOUT CONTRAST TECHNIQUE: Contiguous axial images were obtained from the base of the skull through the vertex without intravenous contrast. COMPARISON:  None. FINDINGS: Brain: Cerebral volume is within normal limits. 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 No midline shift, ventriculomegaly, mass effect, evidence of mass lesion, intracranial hemorrhage or evidence of cortically based acute infarction. Gray-white matter differentiation is within normal limits throughout the brain. Vascular: No suspicious intracranial vascular hyperdensity. Skull: Negative. Sinuses/Orbits: Visualized paranasal sinuses and mastoids are clear. Other: Negative visible orbit and scalp soft tissues. IMPRESSION: Essentially normal noncontrast head CT; asymmetry of the torcula which appears to be a normal anatomic variation.  Electronically Signed   By: Odessa FlemingH  Hall M.D.   On: 07/22/2019 12:20   [place] Abdomen Limited Ruq  Result Date: 07/22/2019 CLINICAL DATA:  Fever and elevated LFTs. EXAM: ULTRASOUND ABDOMEN LIMITED RIGHT UPPER QUADRANT COMPARISON:  None. FINDINGS: Gallbladder: Physiologically distended. No gallstones or wall thickening visualized. No sonographic Murphy sign noted by sonographer. Common bile duct: Diameter: 5 mm. Liver: No focal lesion identified. Heterogeneously increased in parenchymal echogenicity. Portal vein is patent on color Doppler imaging with normal direction of blood flow towards the liver. Other: Technically limited exam due to body habitus. IMPRESSION: 1. Hepatic steatosis without evidence of focal lesion. 2. Normal sonographic appearance of the gallbladder and biliary tree. Electronically Signed   By: Keith Rake M.D.   On: 07/22/2019 14:29   Dg Fluoro Guided Loc Of Needle/cath Tip For Spinal Inject Lt  Result Date: 07/23/2019 CLINICAL DATA:  Fever, chills, body aches, stiff neck. Evaluate for meningitis. EXAM: DIAGNOSTIC LUMBAR PUNCTURE UNDER FLUOROSCOPIC GUIDANCE FLUOROSCOPY TIME:  Radiation Exposure Index (as provided by the fluoroscopic device): 12 sec If the device does not provide the exposure index: Fluoroscopy Time (in minutes and seconds): 2.8 mGy Number of Acquired Images:  0 PROCEDURE: Informed consent was obtained from the patient prior to the procedure, including potential complications of headache, allergy, and pain. With the patient prone, the lower back was prepped with Betadine. 1% Lidocaine was used for local anesthesia. Lumbar puncture was performed at the L4-5 level using a 22 gauge needle with return of clear CSF. 9 ml of CSF were obtained for laboratory studies. The patient tolerated the procedure well and there were no apparent complications. IMPRESSION: Successful fluoroscopic guided lumbar puncture. Electronically Signed   By: Kathreen Devoid   On: 07/23/2019 16:01    EKG:   No orders found for this or any previous visit.  ASSESSMENT AND PLAN:     Patient is 28 year old white male presenting with neck pain and fever.  1.  Neck pain and fever's highly suspicion for meningitis -likely viral-probably enterovirus Patient is clinically feeling better  We will continue Rocephin. Vanco and ampicillin.  Acyclovir for herpes coverage  Seen by infectious disease  LP and CSF labs are ordered to be done by interventional radiology Patient has received a dose of IV Decadron I will hold off on this unless ID physician feels that these are needed Procalcitonin 0.65 and WBC 16.2 Blood cultures with no growth so far.  Febrile last night but no fever today -Based on the CSF results IV vancomycin and ampicillin discontinued.  Patient will be continued on IV Rocephin per ID recommendations acyclovir for herpes coverage.  Doxycycline for tickborne illnesses -  2.  Acute kidney injury from dehydration Clinically improving  creatinine 1.39-1.04   3.  Elevated LFTs I will check a hepatitis panel.  Repeat LFTs ALT is elevated but relatively trending down 96-76  4.    Rash with  elevated LFTs check Lyme titers, [facility] spotted fever Liver ultrasound with the hepatic steatosis  SCDs for DVT prophylaxis   All the records are reviewed and case discussed with Care Management/Social Workerr. Management plans discussed with the patient, mom n dad  Ray- [phone]  and they are in agreement.  CODE STATUS: FC   TOTAL TIME TAKING CARE OF THIS PATIENT: 36 minutes.   POSSIBLE D/C IN 1-2 DAYS, DEPENDING ON CLINICAL CONDITION.  Note: This dictation was prepared with Dragon dictation along with smaller phrase technology. Any transcriptional errors that result from this process are unintentional.   Nicholes Mango M.D on 07/24/2019 at 11:31 AM  Between 7am to 6pm - Pager -  [phone] After 6pm go to [email or web address] - password [email or web address] [facility]  [facility]   Office  [phone]  CC: Primary care physician; Duanne LimerickJones, Deanna C, MD

## 2019-07-24 NOTE — Progress Notes (Addendum)
Pt had three liquid BM's today. Per I/D MD it is okay to give pt anti-diarrhea med. [facility] can order it if needed.

## 2019-07-24 NOTE — Progress Notes (Signed)
ID Still having fever and chills Headache better Diarrhea X 1 today Pt says his cousins 41yr old daughter was sick with fever and respiratory symptoms a week before he became sick.  He was in proximity with her during a bday party    Awake , oriented X5 Able to move his neck- minimal stiffness and pain Rash over the arms and legs fading- over the upper chest there is erythema chest B/L air entry [id number]  CBC Latest Ref Rng & Units 07/24/2019 07/23/2019 07/22/2019  WBC 4.0 - 10.5 K/uL 19.8(H) 16.2(H) 15.4(H)  Hemoglobin 13.0 - 17.0 g/dL 13.4 13.4 15.0  Hematocrit 39.0 - 52.0 % 40.7 41.3 44.8  Platelets 150 - 400 K/uL 341 284 234    CMP Latest Ref Rng & Units 07/24/2019 07/23/2019 07/22/2019  Glucose 70 - 99 mg/dL 125(H) 125(H) 128(H)  BUN 6 - 20 mg/dL 17 18 16   Creatinine 0.61 - 1.24 mg/dL 1.14 1.04 1.39(H)  Sodium 135 - 145 mmol/L 135 136 135  Potassium 3.5 - 5.1 mmol/L 4.0 4.4 4.2  Chloride 98 - 111 mmol/L 99 101 96(L)  CO2 22 - 32 mmol/L 24 24 26   Calcium 8.9 - 10.3 mg/dL 8.5(L) 8.5(L) 9.2  Total Protein 6.5 - 8.1 g/dL 7.2 7.2 8.3(H)  Total Bilirubin 0.3 - 1.2 mg/dL 0.8 0.8 1.3(H)  Alkaline Phos 38 - 126 U/L 58 61 70  AST 15 - 41 U/L 29 21 46(H)  ALT 0 - 44 U/L 76(H) 97(H) 148(H)   CSF Cell count  CSF protein 57 CSF glucose 64 Blood glucose was 125 ( 0.5 ratio) WBC 898 ( 89% N)  Gram stain Neg Culture pending Enterovirus pending HSV pending  CSF saved for tests if needed  Urine strep pneumo antigen negative Blood culture neg Urine culture Ng   RPR NR HIV NEG   Impression /Recommendation  Bacterial meningitis  Score is low-1   ( Gram stain neg- CSF WBC < 2000 CSF neutrophils < 1000 CSF protein <220 Csf glucose  > 34- his is 64 Ratio of csf to blood glucose is 0.5 Peripheral WBC > 10000-    Meningitis- this still looks like viral /meningitis  enterovirus especially with maculopapular rash and csf having normal sugar, minimally elevated protein but  neutrophilic pleocytosis. Other virus like WNV, is remotely possible  Bacterial meningitis less likely but will continue ceftriaxone until results are available,   Herpes meningitis was initially considered because of new partner, but rash not typical for it  - currently on acyclovir ,   On Doxy for possible Tick related illness-  but doubt as no exposure- will DC as no response to it   Leucocytosis- steroid contributing to it as well.  Discussed the management with the patient and his nurse and Dr.Gouru ID will follow him remotely this weekend

## 2019-07-24 NOTE — TOC Progression Note (Signed)
Transition of Care [facility]) - Progression Note    Patient Details  Name: Kenneth Craig MRN: [id number] Date of Birth: [birth year]  [facility] of [facility] CM/SW Contact  Katrina Stack, RN Phone Number: 07/24/2019, 5:24 PM  Clinical Narrative:     Spoke temp 102 within last 24 hours.  Attending unable to anticipate which antibiotic patient to discharge on so unable to obtain  from [facility] today. Doxycycline has been mentioned which is not cost prohibitive      Expected Discharge Plan and Services           Expected Discharge Date: 07/22/19                                     Social Determinants of Health (SDOH) Interventions    Readmission Risk Interventions No flowsheet data found.

## 2019-07-24 NOTE — Plan of Care (Signed)
  Problem: Education: Goal: Knowledge of General Education information will improve Description: Including pain rating scale, medication(s)/side effects and non-pharmacologic comfort measures Outcome: Progressing Pt has an understanding of his diagnosis and POC r/t his hospitalization.  He has an LP pending results.    Problem: Health Behavior/Discharge Planning: Goal: Ability to manage health-related needs will improve Outcome: Progressing Pt has an understanding of his hospitalization.   Problem: Clinical Measurements: Goal: Ability to maintain clinical measurements within normal limits will improve Outcome: Progressing Pt was slightly hypertensive and tachycardic at the beginning of shift.  Will continue to monitor and assess.   Problem: Clinical Measurements: Goal: Will remain free from infection Outcome: Progressing S/Sx of infection monitor and assessed q-shift/ PRN, aong with VS.  Pt has remained afebrile thus far.  He is on abx per MD's orders.  Will continue to monitor and assess.    Problem: Clinical Measurements: Goal: Diagnostic test results will improve Outcome: Progressing Pt has an understanding of his diagnosis and POC r/t his hospitalization.  He has an LP pending results.    Problem: Clinical Measurements: Goal: Respiratory complications will improve Outcome: Progressing Respiratory status monitored and assessed q-shift/ PRN, along with VS.  Pt is on RA with O2 saturations at 94% and respiration rate of 20 breaths per minute.  Pt has not endorsed SOB or DOE.  Will continue to monitor and assess.   Problem: Clinical Measurements: Goal: Cardiovascular complication will be avoided Outcome: Progressing Pt was slightly hypertensive and tachycardic at the beginning of shift.  Will continue to monitor and assess.   Problem: Activity: Goal: Risk for activity intolerance will decrease Outcome: Progressing Pt is a x1 assist to the BR and is able to able to use the  urinal.  He utilizes the RN call light or RN staff ascom phone to ask for assistance to the BR to void.  His output is monitored.  Will continue to monitor and assess.   Problem: Nutrition: Goal: Adequate nutrition will be maintained Outcome: Progressing Pt is on a regular diet.  Will continue to monitor and assess.    Problem: Pain Managment: Goal: General experience of comfort will improve Outcome: Progressing Pt has c/o 4/10 posterior headache pain describing it as an occasional pressure.  Reiterated pain scale so he could adequately rate his pain.  Pt stated his pain goal this hospitalization would be 0/10.  Discussed nonpharmacological methods to help reduce s/sx of pain.  Interventions given per pt's request and MD's orders.  Will continue to monitor and assess.   Problem: Skin Integrity: Goal: Risk for impaired skin integrity will decrease Outcome: Progressing Skin impairment monitored and assessed q-shift/ PRN.  Instructed pt to turn q2 hours to prevent further skin impairment.  Tubes and drains assessed for device related pressure sores.  Pt has a generalized rash.  He had a LP which is pending. Will continue to monitor and assess.

## 2019-07-25 LAB — CBC WITH DIFFERENTIAL/PLATELET
Abs Immature Granulocytes: 1.37 10*3/uL — ABNORMAL HIGH (ref 0.00–0.07)
Basophils Absolute: 0.2 10*3/uL — ABNORMAL HIGH (ref 0.0–0.1)
Basophils Relative: 1 %
Eosinophils Absolute: 0.3 10*3/uL (ref 0.0–0.5)
Eosinophils Relative: 2 %
HCT: 40.6 % (ref 39.0–52.0)
Hemoglobin: 13.3 g/dL (ref 13.0–17.0)
Immature Granulocytes: 7 %
Lymphocytes Relative: 19 %
Lymphs Abs: 3.6 10*3/uL (ref 0.7–4.0)
MCH: 27.5 pg (ref 26.0–34.0)
MCHC: 32.8 g/dL (ref 30.0–36.0)
MCV: 83.9 fL (ref 80.0–100.0)
Monocytes Absolute: 1.8 10*3/uL — ABNORMAL HIGH (ref 0.1–1.0)
Monocytes Relative: 10 %
Neutro Abs: 11.8 10*3/uL — ABNORMAL HIGH (ref 1.7–7.7)
Neutrophils Relative %: 61 %
Platelets: 414 10*3/uL — ABNORMAL HIGH (ref 150–400)
RBC: 4.84 MIL/uL (ref 4.22–5.81)
RDW: 13.1 % (ref 11.5–15.5)
Smear Review: NORMAL
WBC: 19 10*3/uL — ABNORMAL HIGH (ref 4.0–10.5)
nRBC: 0 % (ref 0.0–0.2)

## 2019-07-25 LAB — COMPREHENSIVE METABOLIC PANEL
ALT: 61 U/L — ABNORMAL HIGH (ref 0–44)
AST: 37 U/L (ref 15–41)
Albumin: 3.1 g/dL — ABNORMAL LOW (ref 3.5–5.0)
Alkaline Phosphatase: 55 U/L (ref 38–126)
Anion gap: 12 (ref 5–15)
BUN: 13 mg/dL (ref 6–20)
CO2: 23 mmol/L (ref 22–32)
Calcium: 8 mg/dL — ABNORMAL LOW (ref 8.9–10.3)
Chloride: 103 mmol/L (ref 98–111)
Creatinine, Ser: 0.94 mg/dL (ref 0.61–1.24)
GFR calc Af Amer: >60 mL/min (ref >60)
GFR calc non Af Amer: >60 mL/min (ref >60)
Glucose, Bld: 96 mg/dL (ref 70–99)
Potassium: 4.7 mmol/L (ref 3.5–5.1)
Sodium: 138 mmol/L (ref 135–145)
Total Bilirubin: 1.2 mg/dL (ref 0.3–1.2)
Total Protein: 6.8 g/dL (ref 6.5–8.1)

## 2019-07-25 LAB — HSV DNA BY PCR (REFERENCE LAB)
HSV 1 DNA: NEGATIVE
HSV 2 DNA: NEGATIVE

## 2019-07-25 LAB — B. BURGDORFI ANTIBODIES: B burgdorferi Ab IgG+IgM: <0.91 {ISR} (ref 0.00–0.90)

## 2019-07-25 NOTE — Progress Notes (Signed)
Kenneth Craig NAME: Kenneth Craig    MR#:  [id number]  DATE OF BIRTH:  Jun 20, 1991  SUBJECTIVE:  CHIEF COMPLAINT: Patient is feeling ok   febrile still   REVIEW OF SYSTEMS:  CONSTITUTIONAL: No fever, fatigue or weakness.  EYES: No blurred or double vision.  EARS, NOSE, AND THROAT: No tinnitus or ear pain.  RESPIRATORY: No cough, shortness of breath, wheezing or hemoptysis.  CARDIOVASCULAR: No chest pain, orthopnea, edema.  GASTROINTESTINAL: No nausea, vomiting, diarrhea or abdominal pain.  GENITOURINARY: No dysuria, hematuria.  ENDOCRINE: No polyuria, nocturia,  HEMATOLOGY: No anemia, easy bruising or bleeding SKIN: has rash, no lesion  MUSCULOSKELETAL: No joint pain or arthritis.   NEUROLOGIC: No tingling, numbness, weakness.  PSYCHIATRY: No anxiety or depression.   DRUG ALLERGIES:  No Known Allergies  VITALS:  Blood pressure (!) 147/58, pulse (!) 105, temperature 99.3 F (37.4 C), temperature source Oral, resp. rate 16, height 5\' 9"  (1.753 m), weight (!) 154.2 kg, SpO2 96 %.  PHYSICAL EXAMINATION:  GENERAL:  28 y.o.-year-old patient lying in the bed with no acute distress.  EYES: Pupils equal, round, reactive to light and accommodation. No scleral icterus. Extraocular muscles intact.  HEENT: Head atraumatic, normocephalic. Oropharynx and nasopharynx clear.  NECK:  Supple, no jugular venous distention. No thyroid enlargement, no tenderness.  No neck stiffness.   LUNGS: Normal breath sounds bilaterally, no wheezing, rales,rhonchi or crepitation. No use of accessory muscles of respiration.  CARDIOVASCULAR: S1, S2 normal. No murmurs, rubs, or gallops.  ABDOMEN: Soft, nontender, nondistended. Bowel sounds present.   EXTREMITIES: No pedal edema, cyanosis, or clubbing.  NEUROLOGIC: Cranial nerves II through XII are intact. Muscle strength 5/5 in all extremities. Sensation intact. Gait not checked.  PSYCHIATRIC: The patient  is alert and oriented x 3.  SKIN: Positive maculopapular rash           LABORATORY PANEL:   CBC Recent Labs  Lab 07/25/19 1016  WBC 19.0*  HGB 13.3  HCT 40.6  PLT 414*   ------------------------------------------------------------------------------------------------------------------  Chemistries  Recent Labs  Lab 07/25/19 1016  NA 138  K 4.7  CL 103  CO2 23  GLUCOSE 96  BUN 13  CREATININE 0.94  CALCIUM 8.0*  AST 37  ALT 61*  ALKPHOS 55  BILITOT 1.2   ------------------------------------------------------------------------------------------------------------------  Cardiac Enzymes No results for input(s): TROPONINI in the last 168 hours. ------------------------------------------------------------------------------------------------------------------  RADIOLOGY:  No results found.  EKG:  No orders found for this or any previous visit.  ASSESSMENT AND PLAN:     Patient is 28 year old white male presenting with neck pain and fever.  1.  Neck pain and fever's highly suspicion for meningitis -likely viral-probably enterovirus Patient is clinically feeling better  We will continue Rocephin. Vanco and ampicillin.  Acyclovir for herpes coverage  Seen by infectious disease  LP and CSF labs are done  Patient has received a dose of IV Decadron I will hold off on this unless ID physician feels that these are needed Procalcitonin 0.65 and WBC 16.2 Blood cultures with no growth so far.  Febrile last night  -Based on the CSF results IV vancomycin and ampicillin discontinued. CSF cx neg.  HSV DNA on CSF is negative.  Enterovirus PCR is pending.  West Nile virus antibodies pending All antibiotics will be discontinued and patient will be assessed for the next 24 hours.  Patient is agreeable  doxycycline for tickborne illnesses was discontinued as well by ID -  Leukocytosis most likely from the 1 dose of Decadron given in the ED -  2.  Acute kidney injury from  dehydration Clinically improved creatinine 1.39-1.04-0.94   3.  Elevated LFTs I will check a hepatitis panel.  Repeat LFTs ALT is elevated but relatively trending down 96-76  4.    Rash with  elevated LFTs  check Lyme titers, [facility] spotted [facility] Liver ultrasound with the hepatic steatosis  SCDs for DVT prophylaxis   All the records are reviewed and case discussed with Care Management/Social Workerr. Management plans discussed with the patient, mom n dad  Ray- [phone]  and they are in agreement.  CODE STATUS: FC   TOTAL TIME TAKING CARE OF THIS PATIENT: 36 minutes.   POSSIBLE D/C IN 1-2 DAYS, DEPENDING ON CLINICAL CONDITION.  Note: This dictation was prepared with Dragon dictation along with smaller phrase technology. Any transcriptional errors that result from this process are unintentional.   Kenneth Craig M.D on 07/25/2019 at 2:36 PM  Between 7am to 6pm - Pager - [phone] After 6pm go to [email or web address] - password [email or web address] [facility]  [facility]  Office  [phone]  CC: Primary care physician; Duanne LimerickJones, Deanna C, MD

## 2019-07-25 NOTE — Progress Notes (Signed)
ID Chart reviewed remotely, spoke to patient and his nurse Pt says he is feeling better and was able to jump out of bed this morning- more energy, No headache . Had fever this morning of 100.4. On tylenol Still appetite poor Loose stools X 2 Patient Vitals for the past 24 hrs:  BP Temp Temp src Pulse Resp SpO2  07/25/19 0610 (!) 141/59 (!) 100.4 F (38 C) Oral 99 18 95 %  07/24/19 2356 - 99.5 F (37.5 C) Oral - - -  07/24/19 2032 (!) 157/63 (!) 101.2 F (38.4 C) Oral 99 20 98 %  07/24/19 1518 139/65 99 F (37.2 C) Oral 94 (!) 21 96 %  07/24/19 1300 - (!) 101.1 F (38.4 C) Oral - - -    HSV DNA csf neg CSF culture neg so far Enterovirus PCR -pending Bacterial meningitis  Score is low-1   ( Gram stain neg- CSF WBC < 2000 CSF neutrophils < 1000-  (round 750-800) CSF protein <220- it is 54 Csf glucose  > 34- his is 64 Ratio of csf to blood glucose is 0.5 Peripheral WBC > 10000-  Meningitis-  viral meningitis -likely enterovirusespecially with maculopapular rash and csf having normal sugar, minimally elevated protein but neutrophilic pleocytosis. Other virus like WNV, is possible  Bacterial meningitis unlikely-has been on ceftriaxone , but csf culture so far neg and clinically he does not behave like one   Herpes meningitis was initially considered because of new partner, but rash not typical for it  -as HSV DNA ng Dc acyclovir   Leucocytosis- steroid contributing to it as well. Will check labs today.   Pt insisting on going home today.explained to him as he has not eaten solid food and also not moved around , and as we are stopping all antibiotics today will assess him for another 24 hrs- he is insistent- Communicated with  Dr.Gouru to assess him and talk to him and may be his mom as well.

## 2019-07-26 LAB — CBC WITH DIFFERENTIAL/PLATELET
Abs Immature Granulocytes: 1.66 10*3/uL — ABNORMAL HIGH (ref 0.00–0.07)
Basophils Absolute: 0 10*3/uL (ref 0.0–0.1)
Basophils Relative: 0 %
Eosinophils Absolute: 0.3 10*3/uL (ref 0.0–0.5)
Eosinophils Relative: 2 %
HCT: 44.5 % (ref 39.0–52.0)
Hemoglobin: 14.4 g/dL (ref 13.0–17.0)
Immature Granulocytes: 12 %
Lymphocytes Relative: 21 %
Lymphs Abs: 2.9 10*3/uL (ref 0.7–4.0)
MCH: 27.4 pg (ref 26.0–34.0)
MCHC: 32.4 g/dL (ref 30.0–36.0)
MCV: 84.8 fL (ref 80.0–100.0)
Monocytes Absolute: 1.3 10*3/uL — ABNORMAL HIGH (ref 0.1–1.0)
Monocytes Relative: 9 %
Neutro Abs: 7.8 10*3/uL — ABNORMAL HIGH (ref 1.7–7.7)
Neutrophils Relative %: 56 %
Platelets: 389 10*3/uL (ref 150–400)
RBC: 5.25 MIL/uL (ref 4.22–5.81)
RDW: 13.1 % (ref 11.5–15.5)
Smear Review: NORMAL
WBC: 14 10*3/uL — ABNORMAL HIGH (ref 4.0–10.5)
nRBC: 0 % (ref 0.0–0.2)

## 2019-07-26 LAB — EPSTEIN-BARR VIRUS (EBV) ANTIBODY PROFILE
EBV NA IgG: >600 U/mL — ABNORMAL HIGH (ref 0.0–17.9)
EBV VCA IgG: 298 U/mL — ABNORMAL HIGH (ref 0.0–17.9)
EBV VCA IgM: <36 U/mL (ref 0.0–35.9)

## 2019-07-26 NOTE — Progress Notes (Signed)
[facility] [place] at [facility]   PATIENT NAME: Kenneth Craig    MR#:  [id number]  DATE OF BIRTH:  09/13/1991  SUBJECTIVE:  CHIEF COMPLAINT: Patient is feeling ok   Febrile last night   REVIEW OF SYSTEMS:  CONSTITUTIONAL: No fever, fatigue or weakness.  EYES: No blurred or double vision.  EARS, NOSE, AND THROAT: No tinnitus or ear pain.  RESPIRATORY: No cough, shortness of breath, wheezing or hemoptysis.  CARDIOVASCULAR: No chest pain, orthopnea, edema.  GASTROINTESTINAL: No nausea, vomiting, diarrhea or abdominal pain.  GENITOURINARY: No dysuria, hematuria.  ENDOCRINE: No polyuria, nocturia,  HEMATOLOGY: No anemia, easy bruising or bleeding SKIN: has rash, no lesion  MUSCULOSKELETAL: No joint pain or arthritis.   NEUROLOGIC: No tingling, numbness, weakness.  PSYCHIATRY: No anxiety or depression.   DRUG ALLERGIES:  No Known Allergies  VITALS:  Blood pressure (!) 148/75, pulse 85, temperature 98.7 F (37.1 C), temperature source Oral, resp. rate 20, height 5\' 9"  (1.753 m), weight (!) 154.2 kg, SpO2 96 %.  PHYSICAL EXAMINATION:  GENERAL:  28 y.o.-year-old patient lying in the bed with no acute distress.  EYES: Pupils equal, round, reactive to light and accommodation. No scleral icterus. Extraocular muscles intact.  HEENT: Head atraumatic, normocephalic. Oropharynx and nasopharynx clear.  NECK:  Supple, no jugular venous distention. No thyroid enlargement, no tenderness.  No neck stiffness.   LUNGS: Normal breath sounds bilaterally, no wheezing, rales,rhonchi or crepitation. No use of accessory muscles of respiration.  CARDIOVASCULAR: S1, S2 normal. No murmurs, rubs, or gallops.  ABDOMEN: Soft, nontender, nondistended. Bowel sounds present.   EXTREMITIES: No pedal edema, cyanosis, or clubbing.  NEUROLOGIC: Cranial nerves II through XII are intact. Muscle strength 5/5 in all extremities. Sensation intact. Gait not checked.  PSYCHIATRIC: The patient  is alert and oriented x 3.  SKIN: Positive maculopapular rash           LABORATORY PANEL:   CBC Recent Labs  Lab 07/26/19 0935  WBC 14.0*  HGB 14.4  HCT 44.5  PLT 389   ------------------------------------------------------------------------------------------------------------------  Chemistries  Recent Labs  Lab 07/25/19 1016  NA 138  K 4.7  CL 103  CO2 23  GLUCOSE 96  BUN 13  CREATININE 0.94  CALCIUM 8.0*  AST 37  ALT 61*  ALKPHOS 55  BILITOT 1.2   ------------------------------------------------------------------------------------------------------------------  Cardiac Enzymes No results for input(s): TROPONINI in the last 168 hours. ------------------------------------------------------------------------------------------------------------------  RADIOLOGY:  No results found.  EKG:  No orders found for this or any previous visit.  ASSESSMENT AND PLAN:     Patient is 28 year old white male presenting with neck pain and fever.  1.  Neck pain and fever's highly suspicion for meningitis -likely viral-probably enterovirus Patient is clinically feeling tired  Patient was initially started on Rocephin. Vanco and ampicillin.  Acyclovir for herpes coverage  Seen by infectious disease  LP and CSF labs are done  Patient has received a dose of IV Decadron  Procalcitonin 0.65 and WBC 16.2 Blood cultures with no growth so far.  Febrile last night  -Based on the CSF results IV vancomycin and ampicillin discontinued. CSF cx neg.  HSV DNA on CSF is negative.  Enterovirus PCR is pending.  West Nile virus antibodies pending All antibiotics are  discontinued and patient will be assessed for the next 24 hours.  Patient is agreeable  doxycycline for tickborne illnesses was discontinued as well by ID -Leukocytosis most likely from the 1 dose of Decadron given in  the ED,improving -  2.  Acute kidney injury from dehydration Clinically improved creatinine  1.39-1.04-0.94   3.  Elevated LFTs I will check a hepatitis panel.  Repeat LFTs ALT is elevated but relatively trending down 96-76  4.    Rash with  elevated LFTs  check Lyme titers, [facility] spotted fever Liver ultrasound with the hepatic steatosis  SCDs for DVT prophylaxis   All the records are reviewed and case discussed with Care Management/Social Workerr. Management plans discussed with the patient, mom n dad  Ray- [phone]  and they are in agreement.  CODE STATUS: FC   TOTAL TIME TAKING CARE OF THIS PATIENT: 36 minutes.   POSSIBLE D/C IN 1-2 DAYS, DEPENDING ON CLINICAL CONDITION.  Note: This dictation was prepared with Dragon dictation along with smaller phrase technology. Any transcriptional errors that result from this process are unintentional.   Nicholes Mango M.D on 07/26/2019 at 4:01 PM  Between 7am to 6pm - Pager - [phone] After 6pm go to [email or web address] - password [email or web address] [place] [facility]  Office  [phone]  CC: Primary care physician; Juline Patch, MD

## 2019-07-27 LAB — EHRLICHIA ANTIBODY PANEL
E chaffeensis (HGE) Ab, IgG: NEGATIVE
E chaffeensis (HGE) Ab, IgM: NEGATIVE
E. Chaffeensis (HME) IgM Titer: NEGATIVE
E.Chaffeensis (HME) IgG: NEGATIVE

## 2019-07-27 LAB — CBC WITH DIFFERENTIAL/PLATELET
Abs Immature Granulocytes: 0.7 10*3/uL — ABNORMAL HIGH (ref 0.00–0.07)
Band Neutrophils: 2 %
Basophils Absolute: 0 10*3/uL (ref 0.0–0.1)
Basophils Relative: 0 %
Eosinophils Absolute: 0.5 10*3/uL (ref 0.0–0.5)
Eosinophils Relative: 4 %
HCT: 42.4 % (ref 39.0–52.0)
Hemoglobin: 14 g/dL (ref 13.0–17.0)
Lymphocytes Relative: 35 %
Lymphs Abs: 4.6 10*3/uL — ABNORMAL HIGH (ref 0.7–4.0)
MCH: 27.6 pg (ref 26.0–34.0)
MCHC: 33 g/dL (ref 30.0–36.0)
MCV: 83.6 fL (ref 80.0–100.0)
Metamyelocytes Relative: 2 %
Monocytes Absolute: 0.7 10*3/uL (ref 0.1–1.0)
Monocytes Relative: 5 %
Myelocytes: 3 %
Neutro Abs: 6.6 10*3/uL (ref 1.7–7.7)
Neutrophils Relative %: 49 %
Platelets: 542 10*3/uL — ABNORMAL HIGH (ref 150–400)
RBC: 5.07 MIL/uL (ref 4.22–5.81)
RDW: 13 % (ref 11.5–15.5)
WBC: 13 10*3/uL — ABNORMAL HIGH (ref 4.0–10.5)
nRBC: 0 % (ref 0.0–0.2)

## 2019-07-27 LAB — COMPREHENSIVE METABOLIC PANEL
ALT: 71 U/L — ABNORMAL HIGH (ref 0–44)
AST: 25 U/L (ref 15–41)
Albumin: 3.1 g/dL — ABNORMAL LOW (ref 3.5–5.0)
Alkaline Phosphatase: 42 U/L (ref 38–126)
Anion gap: 9 (ref 5–15)
BUN: 16 mg/dL (ref 6–20)
CO2: 24 mmol/L (ref 22–32)
Calcium: 8.7 mg/dL — ABNORMAL LOW (ref 8.9–10.3)
Chloride: 102 mmol/L (ref 98–111)
Creatinine, Ser: 0.87 mg/dL (ref 0.61–1.24)
GFR calc Af Amer: >60 mL/min (ref >60)
GFR calc non Af Amer: >60 mL/min (ref >60)
Glucose, Bld: 99 mg/dL (ref 70–99)
Potassium: 4.3 mmol/L (ref 3.5–5.1)
Sodium: 135 mmol/L (ref 135–145)
Total Bilirubin: 0.6 mg/dL (ref 0.3–1.2)
Total Protein: 6.9 g/dL (ref 6.5–8.1)

## 2019-07-27 LAB — MISC LABCORP TEST (SEND OUT): Labcorp test code: 138842

## 2019-07-27 LAB — CSF CULTURE W GRAM STAIN: Culture: NO GROWTH

## 2019-07-27 LAB — CULTURE, BLOOD (ROUTINE X 2)
Culture: NO GROWTH
Culture: NO GROWTH
Special Requests: ADEQUATE
Special Requests: ADEQUATE

## 2019-07-27 NOTE — Progress Notes (Signed)
ID Pt doing much better No fever in 36 hrs No headache Minimal neck pain  O/E   Awake, alert oriented X 5 no neck stiffness Patient Vitals for the past 24 hrs:  BP Temp Temp src Pulse Resp SpO2  07/27/19 0522 (!) 151/70 97.8 F (36.6 C) Oral 98 18 96 %  07/26/19 2031 (!) 147/79 98.7 F (37.1 C) Oral 92 20 99 %   Chest B/l air entry HS s1s2 CNS non focal Gait- normal  Skin rash- macular erythema over the chest has faded  Labs  CBC Latest Ref Rng & Units 07/27/2019 07/26/2019 07/25/2019  WBC 4.0 - 10.5 K/uL 13.0(H) 14.0(H) 19.0(H)  Hemoglobin 13.0 - 17.0 g/dL 14.0 14.4 13.3  Hematocrit 39.0 - 52.0 % 42.4 44.5 40.6  Platelets 150 - 400 K/uL 542(H) 389 414(H)    CMP Latest Ref Rng & Units 07/27/2019 07/25/2019 07/24/2019  Glucose 70 - 99 mg/dL 99 96 125(H)  BUN 6 - 20 mg/dL 16 13 17   Creatinine 0.61 - 1.24 mg/dL 0.87 0.94 1.14  Sodium 135 - 145 mmol/L 135 138 135  Potassium 3.5 - 5.1 mmol/L 4.3 4.7 4.0  Chloride 98 - 111 mmol/L 102 103 99  CO2 22 - 32 mmol/L 24 23 24   Calcium 8.9 - 10.3 mg/dL 8.7(L) 8.0(L) 8.5(L)  Total Protein 6.5 - 8.1 g/dL 6.9 6.8 7.2  Total Bilirubin 0.3 - 1.2 mg/dL 0.6 1.2 0.8  Alkaline Phos 38 - 126 U/L 42 55 58  AST 15 - 41 U/L 25 37 29  ALT 0 - 44 U/L 71(H) 61(H) 76(H)    CSF CSF protein 57 CSF glucose 64 Blood glucose was 125 ( 0.5 ratio) WBC 898 ( 89% N)  Gram stain Neg Culture NG Enterovirus pending HSV -NG CSF saved for tests if needed  Urine strep pneumo antigen negative Blood culture neg Urine culture Ng   RPR NR HIV NEG  EBV Igg present- no igm  Impression/recommendation-  Meningitis- likely viral - neutrophilic pleocytosis makes Enterovirus a high suspect  VS WNV- both labs pending. Not on any antibiotics or antiviral in the past 48 hrs. ( ceftriaxone, acyclovir,  stopped on Saturday) No fever , rash almost resolved, headache resolved- pt will be going home today. Asked him to stay off work for another week and also not  to be taking care of his 26 year old sone for another week  Leucocytosis- much improved Abnormal lfts improved   Discussed with him and his Mother Will call him once labs result

## 2019-07-27 NOTE — Discharge Summary (Signed)
Kenneth Craig at [place] NAME: Kenneth Craig    MR#:  [id number]  DATE OF BIRTH:  03/29/1991  DATE OF ADMISSION:  07/22/2019   ADMITTING PHYSICIAN: Dustin Flock, MD  DATE OF DISCHARGE: 07/27/2019 12:05 PM  PRIMARY CARE PHYSICIAN: Juline Patch, MD   ADMISSION DIAGNOSIS:  Bad headache [R51] Sepsis, due to unspecified organism, unspecified whether acute organ dysfunction present ([place]) [A41.9] DISCHARGE DIAGNOSIS:  Active Problems:   Meningitis  SECONDARY DIAGNOSIS:  History reviewed. No pertinent past medical history. HOSPITAL COURSE:   Kenneth Craig is a 28 year old male who presented to the ED with fevers, headache, and neck stiffness.  He was meeting sepsis criteria.  He was admitted for further management.  Sepsis- likely due to viral meningitis.  Meeting sepsis criteria in the ED with fever and leukocytosis.  Sepsis has resolved. -LP performed 7/30 and did not show bacterial meningitis -Initially on broad-spectrum antibiotics and acyclovir, but these were discontinued -Seen by ID, who felt that patient's presentation was consistent with enterovirus meningitis -Blood cultures with no growth -Treated with doxycycline for a brief period of time due to concern for possible tickborne illness, but this was stopped by ID  Acute kidney injury from dehydration-resolved  Elevated LFTs and rash-resolved -Acute hepatitis panel was negative -Liver ultrasound showed hepatic steatosis -RMSF labs pending on discharge -Ehrlichia and Lyme labs were negative  Hypertension-blood pressure noted to be elevated intermittently throughout hospitalization -Needs BP followed up as an outpatient -No BP meds were started this admission   DISCHARGE CONDITIONS:  Viral meningitis Hepatic steatosis CONSULTS OBTAINED:  ID IR DRUG ALLERGIES:  No Known Allergies DISCHARGE MEDICATIONS:   Allergies as of 07/27/2019   No Known Allergies     Medication List     TAKE these medications   acetaminophen 325 MG tablet Commonly known as: TYLENOL Take 650 mg by mouth every 6 (six) hours as needed.   Biotin 10 MG Tabs Take 10 mg by mouth daily.   ibuprofen 200 MG tablet Commonly known as: ADVIL Take 200 mg by mouth every 6 (six) hours as needed.   naproxen sodium 220 MG tablet Commonly known as: ALEVE Take 220 mg by mouth 2 (two) times daily as needed.   ondansetron 4 MG disintegrating tablet Commonly known as: Zofran ODT Take 1 tablet (4 mg total) by mouth every 8 (eight) hours as needed.        DISCHARGE INSTRUCTIONS:  1.  Follow-up with PCP in 5 days 2.  Needs BP followed as an outpatient DIET:  Cardiac diet DISCHARGE CONDITION:  Stable ACTIVITY:  As tolerated OXYGEN:  Home Oxygen: No.  Oxygen Delivery: room air DISCHARGE LOCATION:  home   If you experience worsening of your admission symptoms, develop shortness of breath, life threatening emergency, suicidal or homicidal thoughts you must seek medical attention immediately by calling 911 or calling your MD immediately  if symptoms less severe.  You Must read complete instructions/literature along with all the possible adverse reactions/side effects for all the Medicines you take and that have been prescribed to you. Take any new Medicines after you have completely understood and accpet all the possible adverse reactions/side effects.   Please note  You were cared for by a hospitalist during your hospital stay. If you have any questions about your discharge medications or the care you received while you were in the hospital after you are discharged, you can call the unit and asked to speak with the  hospitalist on call if the hospitalist that took care of you is not available. Once you are discharged, your primary care physician will handle any further medical issues. Please note that NO REFILLS for any discharge medications will be authorized once you are discharged, as it is  imperative that you return to your primary care physician (or establish a relationship with a primary care physician if you do not have one) for your aftercare needs so that they can reassess your need for medications and monitor your lab values.    On the day of Discharge:  VITAL SIGNS:  Blood pressure (!) 151/70, pulse 98, temperature 97.8 F (36.6 C), temperature source Oral, resp. rate 18, height 5\' 9"  (1.753 m), weight (!) 154.2 kg, SpO2 96 %. PHYSICAL EXAMINATION:  GENERAL:  28 y.o.-year-old patient lying in the bed with no acute distress.  EYES: Pupils equal, round, reactive to light and accommodation. No scleral icterus. Extraocular muscles intact.  HEENT: Head atraumatic, normocephalic. Oropharynx and nasopharynx clear.  NECK:  Supple, no jugular venous distention. No thyroid enlargement, no tenderness.  LUNGS: Normal breath sounds bilaterally, no wheezing, rales,rhonchi or crepitation. No use of accessory muscles of respiration.  CARDIOVASCULAR: S1, S2 normal. No murmurs, rubs, or gallops.  ABDOMEN: Soft, non-tender, non-distended. Bowel sounds present. No organomegaly or mass.  EXTREMITIES: No pedal edema, cyanosis, or clubbing.  NEUROLOGIC: Cranial nerves II through XII are intact. Muscle strength 5/5 in all extremities. Sensation intact. Gait not checked.  PSYCHIATRIC: The patient is alert and oriented x 3.  SKIN: No obvious rash, lesion, or ulcer.  DATA REVIEW:   CBC Recent Labs  Lab 07/27/19 0551  WBC 13.0*  HGB 14.0  HCT 42.4  PLT 542*    Chemistries  Recent Labs  Lab 07/27/19 0551  NA 135  K 4.3  CL 102  CO2 24  GLUCOSE 99  BUN 16  CREATININE 0.87  CALCIUM 8.7*  AST 25  ALT 71*  ALKPHOS 42  BILITOT 0.6     Microbiology Results  Results for orders placed or performed during the hospital encounter of 07/22/19  Urine culture     Status: Abnormal   Collection Time: 07/22/19 11:22 AM   Specimen: Urine, Random  Result Value Ref Range Status    Specimen Description   Final    URINE, RANDOM Performed at [facility], [address] [place], [place] [address]    Special Requests   Final    NONE Performed at [facility], [address] [place], [place] [address]    Culture (A)  Final    <10,000 COLONIES/mL INSIGNIFICANT GROWTH Performed at [facility], [address] [place], [place] [address]    Report Status 07/23/2019 FINAL  Final  SARS Coronavirus 2 (CEPHEID- Performed in [facility] hospital lab), Hosp Order     Status: None   Collection Time: 07/22/19 12:13 PM   Specimen: Nasopharyngeal Swab  Result Value Ref Range Status   SARS Coronavirus 2 NEGATIVE NEGATIVE Final    Comment: (NOTE) If result is NEGATIVE SARS-CoV-2 target nucleic acids are NOT DETECTED. The SARS-CoV-2 RNA is generally detectable in upper and lower  respiratory specimens during the acute phase of infection. The lowest  concentration of SARS-CoV-2 viral copies this assay can detect is 250  copies / mL. A negative result does not preclude SARS-CoV-2 infection  and should not be used as the sole basis for treatment or other  patient management decisions.  A negative  result may occur with  improper specimen collection / handling, submission of specimen other  than nasopharyngeal swab, presence of viral mutation(s) within the  areas targeted by this assay, and inadequate number of viral copies  (<250 copies / mL). A negative result must be combined with clinical  observations, patient history, and epidemiological information. If result is POSITIVE SARS-CoV-2 target nucleic acids are DETECTED. The SARS-CoV-2 RNA is generally detectable in upper and lower  respiratory specimens dur ing the acute phase of infection.  Positive  results are indicative of active infection with SARS-CoV-2.  Clinical  correlation with patient history and other diagnostic information is  necessary to determine patient infection status.  Positive  results do  not rule out bacterial infection or co-infection with other viruses. If result is PRESUMPTIVE POSTIVE SARS-CoV-2 nucleic acids MAY BE PRESENT.   A presumptive positive result was obtained on the submitted specimen  and confirmed on repeat testing.  While 2019 novel coronavirus  (SARS-CoV-2) nucleic acids may be present in the submitted sample  additional confirmatory testing may be necessary for epidemiological  and / or clinical management purposes  to differentiate between  SARS-CoV-2 and other Sarbecovirus currently known to infect humans.  If clinically indicated additional testing with an alternate test  methodology [phone] is advised. The SARS-CoV-2 RNA is generally  detectable in upper and lower respiratory sp ecimens during the acute  phase of infection. The expected result is Negative. Fact Sheet for Patients:  BoilerBrush.com.cyhttps://www.fda.gov/media/136312/download Fact Sheet for Healthcare Providers: https://pope.com/https://www.fda.gov/media/136313/download This test is not yet approved or cleared by the [place] FDA and has been authorized for detection and/or diagnosis of SARS-CoV-2 by FDA under an Emergency Use Authorization (EUA).  This EUA will remain in effect (meaning this test can be used) for the duration of the COVID-19 declaration under Section 564(b)(1) of the Act, 21 U.S.C. section 360bbb-3(b)(1), unless the authorization is terminated or revoked sooner. Performed at [facility], [address] [place], [place] [address]   Culture, blood (routine x 2)     Status: None   Collection Time: 07/22/19  5:05 PM   Specimen: BLOOD  Result Value Ref Range Status   Specimen Description BLOOD RIGHT ANTECUBITAL  Final   Special Requests   Final    BOTTLES DRAWN AEROBIC AND ANAEROBIC Blood Culture adequate volume   Culture   Final    NO GROWTH 5 DAYS Performed at [facility], [address] [place], [place] [address]    Report Status 07/27/2019 FINAL   Final  Culture, blood (routine x 2)     Status: None   Collection Time: 07/22/19  5:05 PM   Specimen: BLOOD  Result Value Ref Range Status   Specimen Description BLOOD BLOOD RIGHT ARM  Final   Special Requests   Final    BOTTLES DRAWN AEROBIC AND ANAEROBIC Blood Culture adequate volume   Culture   Final    NO GROWTH 5 DAYS Performed at [facility], [address] [place], [place] [address]    Report Status 07/27/2019 FINAL  Final  CSF culture     Status: None   Collection Time: 07/23/19  2:20 PM   Specimen: [facility] Cytology CSF; Cerebrospinal Fluid  Result Value Ref Range Status   Specimen Description   Final    CSF Performed at [facility], [address] [place], [place] [address]    Special Requests   Final    NONE Performed at [facility], [phone] [place], [place] [address]    Gram Stain   Final    CYTOSPIN SLIDE WBC PRESENT,BOTH PMN AND MONONUCLEAR NO ORGANISMS SEEN NO RBC SEEN Performed at [facility], [address] [place], [place] [address]    Culture   Final    NO GROWTH 3 DAYS Performed at [facility], [address] [place], [place] [address]    Report Status 07/27/2019 FINAL  Final  CULTURE, BLOOD (ROUTINE X 2) w Reflex to ID Panel     Status: None (Preliminary result)   Collection Time: 07/24/19  2:49 PM   Specimen: BLOOD  Result Value Ref Range Status   Specimen Description BLOOD BLOOD RIGHT HAND  Final   Special Requests   Final    BOTTLES DRAWN AEROBIC AND ANAEROBIC Blood Culture adequate volume   Culture   Final    NO GROWTH 3 DAYS Performed at [facility], [address] [place], [place] [address]    Report Status PENDING  Incomplete  CULTURE, BLOOD (ROUTINE X 2) w Reflex to ID Panel     Status: None (Preliminary result)   Collection Time: 07/24/19  6:44 PM   Specimen: BLOOD  Result Value Ref Range Status   Specimen Description BLOOD BLOOD LEFT FOREARM  Final   Special  Requests   Final    BOTTLES DRAWN AEROBIC AND ANAEROBIC Blood Culture adequate volume   Culture   Final    NO GROWTH 3 DAYS Performed at [facility], [address] [place], [place] [address]    Report Status PENDING  Incomplete    RADIOLOGY:  No results found.   Management plans discussed with the patient, family and they are in agreement.  CODE STATUS: Prior   TOTAL TIME TAKING CARE OF THIS PATIENT: 45 minutes.    Jinny BlossomKaty D Mayo M.D on 07/27/2019 at 5:31 PM  Between 7am to 6pm - Pager [phone]- [phone]  After 6pm go to [email or web address] - Social research officer, governmentpassword [email or web address] [email or web address]  [facility] [place] [facility]  Office  [phone]  CC: Primary care physician; Duanne LimerickJones, Deanna C, MD   Note: This dictation was prepared with Dragon dictation along with smaller phrase technology. Any transcriptional errors that result from this process are unintentional.

## 2019-07-27 NOTE — Progress Notes (Signed)
Discharge order received. Patient mental status is at baseline. Vital signs stable . No signs of acute distress. Discharge instructions given. Patient verbalized understanding. No other issues noted at this time.   

## 2019-07-27 NOTE — Discharge Instructions (Signed)
It was so nice to meet you during this hospitalization!  You came into the hospital with neck pain and fever. We think you had a viral meningitis. You do not need any more antibiotics.  Your blood pressures were high while you were in the hospital. Please make sure you see your primary care doctor so that she can monitor your blood pressure outside the hospital.  Take care, Dr. Brett Albino

## 2019-07-28 LAB — RMSF, IGG, IFA: RMSF, IGG, IFA: 1:64 {titer} — ABNORMAL HIGH

## 2019-07-28 LAB — ROCKY MTN SPOTTED FVR ABS PNL(IGG+IGM)
RMSF IgG: POSITIVE — AB
RMSF IgM: 0.48 index (ref 0.00–0.89)

## 2019-07-29 LAB — CULTURE, BLOOD (ROUTINE X 2)
Culture: NO GROWTH
Culture: NO GROWTH
Special Requests: ADEQUATE
Special Requests: ADEQUATE

## 2019-07-29 LAB — MISC LABCORP TEST (SEND OUT): Labcorp test code: 138966

## 2019-08-01 LAB — ENTEROVIRUS PCR: Enterovirus PCR: NEGATIVE

## 2019-08-03 ENCOUNTER — Telehealth: Payer: Self-pay | Admitting: Licensed Clinical Social Worker

## 2019-08-03 NOTE — Telephone Encounter (Signed)
I called and left a message for the patient to call me back to set up a hospital follow up for this week.

## 2020-08-04 ENCOUNTER — Other Ambulatory Visit: Payer: Self-pay

## 2020-08-04 ENCOUNTER — Encounter: Payer: Self-pay | Admitting: Family Medicine

## 2020-08-04 ENCOUNTER — Ambulatory Visit: Payer: 59 | Admitting: Family Medicine

## 2020-08-04 VITALS — BP 180/120 | HR 96 | Ht 69.0 in | Wt 360.0 lb

## 2020-08-04 DIAGNOSIS — Z6841 Body Mass Index (BMI) 40.0 and over, adult: Secondary | ICD-10-CM | POA: Diagnosis not present

## 2020-08-04 DIAGNOSIS — L301 Dyshidrosis [pompholyx]: Secondary | ICD-10-CM

## 2020-08-04 DIAGNOSIS — I10 Essential (primary) hypertension: Secondary | ICD-10-CM

## 2020-08-04 DIAGNOSIS — Z7689 Persons encountering health services in other specified circumstances: Secondary | ICD-10-CM | POA: Diagnosis not present

## 2020-08-04 LAB — GLUCOSE, POCT (MANUAL RESULT ENTRY): POC Glucose: 147 mg/dl — AB (ref 70–99)

## 2020-08-04 MED ORDER — HYDROCHLOROTHIAZIDE 12.5 MG PO TABS: 12.5000 mg | ORAL_TABLET | Freq: Every day | ORAL | 0 refills | Status: DC

## 2020-08-04 NOTE — Patient Instructions (Signed)
Low-Sodium Eating Plan Sodium, which is an element that makes up salt, helps you maintain a healthy balance of fluids in your body. Too much sodium can increase your blood pressure and cause fluid and waste to be held in your body. Your health care provider or dietitian may recommend following this plan if you have high blood pressure (hypertension), kidney disease, liver disease, or heart failure. Eating less sodium can help lower your blood pressure, reduce swelling, and protect your heart, liver, and kidneys. What are tips for following this plan? General guidelines  Most people on this plan should limit their sodium intake to 1,500-2,000 mg (milligrams) of sodium each day. Reading food labels   The Nutrition Facts label lists the amount of sodium in one serving of the food. If you eat more than one serving, you must multiply the listed amount of sodium by the number of servings.  Choose foods with less than 140 mg of sodium per serving.  Avoid foods with 300 mg of sodium or more per serving. Shopping  Look for lower-sodium products, often labeled as "low-sodium" or "no salt added."  Always check the sodium content even if foods are labeled as "unsalted" or "no salt added".  Buy fresh foods. ? Avoid canned foods and premade or frozen meals. ? Avoid canned, cured, or processed meats  Buy breads that have less than 80 mg of sodium per slice. Cooking  Eat more home-cooked food and less restaurant, buffet, and fast food.  Avoid adding salt when cooking. Use salt-free seasonings or herbs instead of table salt or sea salt. Check with your health care provider or pharmacist before using salt substitutes.  Cook with plant-based oils, such as canola, sunflower, or olive oil. Meal planning  When eating at a restaurant, ask that your food be prepared with less salt or no salt, if possible.  Avoid foods that contain MSG (monosodium glutamate). MSG is sometimes added to Chinese food,  bouillon, and some canned foods. What foods are recommended? The items listed may not be a complete list. Talk with your dietitian about what dietary choices are best for you. Grains Low-sodium cereals, including oats, puffed wheat and rice, and shredded wheat. Low-sodium crackers. Unsalted rice. Unsalted pasta. Low-sodium bread. Whole-grain breads and whole-grain pasta. Vegetables Fresh or frozen vegetables. "No salt added" canned vegetables. "No salt added" tomato sauce and paste. Low-sodium or reduced-sodium tomato and vegetable juice. Fruits Fresh, frozen, or canned fruit. Fruit juice. Meats and other protein foods Fresh or frozen (no salt added) meat, poultry, seafood, and fish. Low-sodium canned tuna and salmon. Unsalted nuts. Dried peas, beans, and lentils without added salt. Unsalted canned beans. Eggs. Unsalted nut butters. Dairy Milk. Soy milk. Cheese that is naturally low in sodium, such as ricotta cheese, fresh mozzarella, or Swiss cheese Low-sodium or reduced-sodium cheese. Cream cheese. Yogurt. Fats and oils Unsalted butter. Unsalted margarine with no trans fat. Vegetable oils such as canola or olive oils. Seasonings and other foods Fresh and dried herbs and spices. Salt-free seasonings. Low-sodium mustard and ketchup. Sodium-free salad dressing. Sodium-free light mayonnaise. Fresh or refrigerated horseradish. Lemon juice. Vinegar. Homemade, reduced-sodium, or low-sodium soups. Unsalted popcorn and pretzels. Low-salt or salt-free chips. What foods are not recommended? The items listed may not be a complete list. Talk with your dietitian about what dietary choices are best for you. Grains Instant hot cereals. Bread stuffing, pancake, and biscuit mixes. Croutons. Seasoned rice or pasta mixes. Noodle soup cups. Boxed or frozen macaroni and cheese. Regular salted   crackers. Self-rising flour. Vegetables Sauerkraut, pickled vegetables, and relishes. Olives. French fries. Onion rings.  Regular canned vegetables (not low-sodium or reduced-sodium). Regular canned tomato sauce and paste (not low-sodium or reduced-sodium). Regular tomato and vegetable juice (not low-sodium or reduced-sodium). Frozen vegetables in sauces. Meats and other protein foods Meat or fish that is salted, canned, smoked, spiced, or pickled. Bacon, ham, sausage, hotdogs, corned beef, chipped beef, packaged lunch meats, salt pork, jerky, pickled herring, anchovies, regular canned tuna, sardines, salted nuts. Dairy Processed cheese and cheese spreads. Cheese curds. Blue cheese. Feta cheese. String cheese. Regular cottage cheese. Buttermilk. Canned milk. Fats and oils Salted butter. Regular margarine. Ghee. Bacon fat. Seasonings and other foods Onion salt, garlic salt, seasoned salt, table salt, and sea salt. Canned and packaged gravies. Worcestershire sauce. Tartar sauce. Barbecue sauce. Teriyaki sauce. Soy sauce, including reduced-sodium. Steak sauce. Fish sauce. Oyster sauce. Cocktail sauce. Horseradish that you find on the shelf. Regular ketchup and mustard. Meat flavorings and tenderizers. Bouillon cubes. Hot sauce and Tabasco sauce. Premade or packaged marinades. Premade or packaged taco seasonings. Relishes. Regular salad dressings. Salsa. Potato and tortilla chips. Corn chips and puffs. Salted popcorn and pretzels. Canned or dried soups. Pizza. Frozen entrees and pot pies. Summary  Eating less sodium can help lower your blood pressure, reduce swelling, and protect your heart, liver, and kidneys.  Most people on this plan should limit their sodium intake to 1,500-2,000 mg (milligrams) of sodium each day.  Canned, boxed, and frozen foods are high in sodium. Restaurant foods, fast foods, and pizza are also very high in sodium. You also get sodium by adding salt to food.  Try to cook at home, eat more fresh fruits and vegetables, and eat less fast food, canned, processed, or prepared foods. This information is  not intended to replace advice given to you by your health care provider. Make sure you discuss any questions you have with your health care provider. Document Revised: 11/22/2017 Document Reviewed: 12/03/2016 Elsevier Patient Education  2020 [employer] DASH Eating Plan DASH stands for "Dietary Approaches to Stop Hypertension." The DASH eating plan is a healthy eating plan that has been shown to reduce high blood pressure (hypertension). It may also reduce your risk for type 2 diabetes, heart disease, and stroke. The DASH eating plan may also help with weight loss. What are tips for following this plan?  General guidelines  Avoid eating more than 2,300 mg (milligrams) of salt (sodium) a day. If you have hypertension, you may need to reduce your sodium intake to 1,500 mg a day.  Limit alcohol intake to no more than 1 drink a day for nonpregnant women and 2 drinks a day for men. One drink equals 12 oz of beer, 5 oz of wine, or 1 oz of hard liquor.  Work with your health care provider to maintain a healthy body weight or to lose weight. Ask what an ideal weight is for you.  Get at least 30 minutes of exercise that causes your heart to beat faster (aerobic exercise) most days of the week. Activities may include walking, swimming, or biking.  Work with your health care provider or diet and nutrition specialist (dietitian) to adjust your eating plan to your individual calorie needs. Reading food labels   Check food labels for the amount of sodium per serving. Choose foods with less than 5 percent of the Daily Value of sodium. Generally, foods with less than 300 mg of sodium per serving fit into this eating plan.    To find whole grains, look for the word "whole" as the first word in the ingredient list. Shopping  Buy products labeled as "low-sodium" or "no salt added."  Buy fresh foods. Avoid canned foods and premade or frozen meals. Cooking  Avoid adding salt when cooking. Use salt-free  seasonings or herbs instead of table salt or sea salt. Check with your health care provider or pharmacist before using salt substitutes.  Do not fry foods. Cook foods using healthy methods such as baking, boiling, grilling, and broiling instead.  Cook with heart-healthy oils, such as olive, canola, soybean, or sunflower oil. Meal planning  Eat a balanced diet that includes: ? 5 or more servings of fruits and vegetables each day. At each meal, try to fill half of your plate with fruits and vegetables. ? Up to 6-8 servings of whole grains each day. ? Less than 6 oz of lean meat, poultry, or fish each day. A 3-oz serving of meat is about the same size as a deck of cards. One egg equals 1 oz. ? 2 servings of low-fat dairy each day. ? A serving of nuts, seeds, or beans 5 times each week. ? Heart-healthy fats. Healthy fats called Omega-3 fatty acids are found in foods such as flaxseeds and coldwater fish, like sardines, salmon, and mackerel.  Limit how much you eat of the following: ? Canned or prepackaged foods. ? Food that is high in trans fat, such as fried foods. ? Food that is high in saturated fat, such as fatty meat. ? Sweets, desserts, sugary drinks, and other foods with added sugar. ? Full-fat dairy products.  Do not salt foods before eating.  Try to eat at least 2 vegetarian meals each week.  Eat more home-cooked food and less restaurant, buffet, and fast food.  When eating at a restaurant, ask that your food be prepared with less salt or no salt, if possible. What foods are recommended? The items listed may not be a complete list. Talk with your dietitian about what dietary choices are best for you. Grains Whole-grain or whole-wheat bread. Whole-grain or whole-wheat pasta. Brown rice. Oatmeal. Quinoa. Bulgur. Whole-grain and low-sodium cereals. Pita bread. Low-fat, low-sodium crackers. Whole-wheat flour tortillas. Vegetables Fresh or frozen vegetables (raw, steamed, roasted, or  grilled). Low-sodium or reduced-sodium tomato and vegetable juice. Low-sodium or reduced-sodium tomato sauce and tomato paste. Low-sodium or reduced-sodium canned vegetables. Fruits All fresh, dried, or frozen fruit. Canned fruit in natural juice (without added sugar). Meat and other protein foods Skinless chicken or turkey. Ground chicken or turkey. Pork with fat trimmed off. Fish and seafood. Egg whites. Dried beans, peas, or lentils. Unsalted nuts, nut butters, and seeds. Unsalted canned beans. Lean cuts of beef with fat trimmed off. Low-sodium, lean deli meat. Dairy Low-fat (1%) or fat-free (skim) milk. Fat-free, low-fat, or reduced-fat cheeses. Nonfat, low-sodium ricotta or cottage cheese. Low-fat or nonfat yogurt. Low-fat, low-sodium cheese. Fats and oils Soft margarine without trans fats. Vegetable oil. Low-fat, reduced-fat, or light mayonnaise and salad dressings (reduced-sodium). Canola, safflower, olive, soybean, and sunflower oils. Avocado. Seasoning and other foods Herbs. Spices. Seasoning mixes without salt. Unsalted popcorn and pretzels. Fat-free sweets. What foods are not recommended? The items listed may not be a complete list. Talk with your dietitian about what dietary choices are best for you. Grains Baked goods made with fat, such as croissants, muffins, or some breads. Dry pasta or rice meal packs. Vegetables Creamed or fried vegetables. Vegetables in a cheese sauce. Regular canned vegetables (not   low-sodium or reduced-sodium). Regular canned tomato sauce and paste (not low-sodium or reduced-sodium). Regular tomato and vegetable juice (not low-sodium or reduced-sodium). Rosita Fire. Olives. Fruits Canned fruit in a light or heavy syrup. Fried fruit. Fruit in cream or butter sauce. Meat and other protein foods Fatty cuts of meat. Ribs. Fried meat. Tomasa Blase. Sausage. Bologna and other processed lunch meats. Salami. Fatback. Hotdogs. Bratwurst. Salted nuts and seeds. Canned beans with  added salt. Canned or smoked fish. Whole eggs or egg yolks. Chicken or [place] with skin. Dairy Whole or 2% milk, cream, and half-and-half. Whole or full-fat cream cheese. Whole-fat or sweetened yogurt. Full-fat cheese. Nondairy creamers. Whipped toppings. Processed cheese and cheese spreads. Fats and oils Butter. Stick margarine. Lard. Shortening. Ghee. Bacon fat. Tropical oils, such as coconut, palm kernel, or palm oil. Seasoning and other foods Salted popcorn and pretzels. Onion salt, garlic salt, seasoned salt, table salt, and sea salt. Worcestershire sauce. Tartar sauce. Barbecue sauce. Teriyaki sauce. Soy sauce, including reduced-sodium. Steak sauce. Canned and packaged gravies. Fish sauce. Oyster sauce. Cocktail sauce. Horseradish that you find on the shelf. Ketchup. Mustard. Meat flavorings and tenderizers. Bouillon cubes. Hot sauce and Tabasco sauce. Premade or packaged marinades. Premade or packaged taco seasonings. Relishes. Regular salad dressings. Where to find more information:  National Heart, Lung, and Blood Institute: PopSteam.is  American Heart Association: www.heart.org Summary  The DASH eating plan is a healthy eating plan that has been shown to reduce high blood pressure (hypertension). It may also reduce your risk for type 2 diabetes, heart disease, and stroke.  With the DASH eating plan, you should limit salt (sodium) intake to 2,300 mg a day. If you have hypertension, you may need to reduce your sodium intake to 1,500 mg a day.  When on the DASH eating plan, aim to eat more fresh fruits and vegetables, whole grains, lean proteins, low-fat dairy, and heart-healthy fats.  Work with your health care provider or diet and nutrition specialist (dietitian) to adjust your eating plan to your individual calorie needs. This information is not intended to replace advice given to you by your health care provider. Make sure you discuss any questions you have with your health care  provider. Document Revised: 11/22/2017 Document Reviewed: 12/03/2016 Elsevier Patient Education  2020 [employer] Inc[employer] Calorie Counting for [employer] Loss Calories are units of energy. Your body needs a certain amount of calories from food to keep you going throughout the day. When you eat more calories than your body needs, your body stores the extra calories as fat. When you eat fewer calories than your body needs, your body burns fat to get the energy it needs. Calorie counting means keeping track of how many calories you eat and drink each day. Calorie counting can be helpful if you need to lose weight. If you make sure to eat fewer calories than your body needs, you should lose weight. Ask your health care provider what a healthy weight is for you. For calorie counting to work, you will need to eat the right number of calories in a day in order to lose a healthy amount of weight per week. A dietitian can help you determine how many calories you need in a day and will give you suggestions on how to reach your calorie goal.  A healthy amount of weight to lose per week is usually 1-2 lb (0.5-0.9 kg). This usually means that your daily calorie intake should be reduced by 500-750 calories.  Eating 1,200 - 1,500 calories per  day can help most women lose weight.  Eating 1,500 - 1,800 calories per day can help most men lose weight. What is my plan? My goal is to have __________ calories per day. If I have this many calories per day, I should lose around __________ pounds per week. What do I need to know about calorie counting? In order to meet your daily calorie goal, you will need to:  Find out how many calories are in each food you would like to eat. Try to do this before you eat.  Decide how much of the food you plan to eat.  Write down what you ate and how many calories it had. Doing this is called keeping a food log. To successfully lose weight, it is important to balance calorie counting with a  healthy lifestyle that includes regular activity. Aim for 150 minutes of moderate exercise (such as walking) or 75 minutes of vigorous exercise (such as running) each week. Where do I find calorie information?  The number of calories in a food can be found on a Nutrition Facts label. If a food does not have a Nutrition Facts label, try to look up the calories online or ask your dietitian for help. Remember that calories are listed per serving. If you choose to have more than one serving of a food, you will have to multiply the calories per serving by the amount of servings you plan to eat. For example, the label on a package of bread might say that a serving size is 1 slice and that there are 90 calories in a serving. If you eat 1 slice, you will have eaten 90 calories. If you eat 2 slices, you will have eaten 180 calories. How do I keep a food log? Immediately after each meal, record the following information in your food log:  What you ate. Don't forget to include toppings, sauces, and other extras on the food.  How much you ate. This can be measured in cups, ounces, or number of items.  How many calories each food and drink had.  The total number of calories in the meal. Keep your food log near you, such as in a small notebook in your pocket, or use a mobile app or website. Some programs will calculate calories for you and show you how many calories you have left for the day to meet your goal. What are some calorie counting tips?   Use your calories on foods and drinks that will fill you up and not leave you hungry: ? Some examples of foods that fill you up are nuts and nut butters, vegetables, lean proteins, and high-fiber foods like whole grains. High-fiber foods are foods with more than 5 g fiber per serving. ? Drinks such as sodas, specialty coffee drinks, alcohol, and juices have a lot of calories, yet do not fill you up.  Eat nutritious foods and avoid empty calories. Empty calories  are calories you get from foods or beverages that do not have many vitamins or protein, such as candy, sweets, and soda. It is better to have a nutritious high-calorie food (such as an avocado) than a food with few nutrients (such as a bag of chips).  Know how many calories are in the foods you eat most often. This will help you calculate calorie counts faster.  Pay attention to calories in drinks. Low-calorie drinks include water and unsweetened drinks.  Pay attention to nutrition labels for "low fat" or "fat free" foods. These foods  sometimes have the same amount of calories or more calories than the full fat versions. They also often have added sugar, starch, or salt, to make up for flavor that was removed with the fat.  Find a way of tracking calories that works for you. Get creative. Try different apps or programs if writing down calories does not work for you. What are some portion control tips?  Know how many calories are in a serving. This will help you know how many servings of a certain food you can have.  Use a measuring cup to measure serving sizes. You could also try weighing out portions on a kitchen scale. With time, you will be able to estimate serving sizes for some foods.  Take some time to put servings of different foods on your favorite plates, bowls, and cups so you know what a serving looks like.  Try not to eat straight from a bag or box. Doing this can lead to overeating. Put the amount you would like to eat in a cup or on a plate to make sure you are eating the right portion.  Use smaller plates, glasses, and bowls to prevent overeating.  Try not to multitask (for example, watch TV or use your computer) while eating. If it is time to eat, sit down at a table and enjoy your food. This will help you to know when you are full. It will also help you to be aware of what you are eating and how much you are eating. What are tips for following this plan? Reading food  labels  Check the calorie count compared to the serving size. The serving size may be smaller than what you are used to eating.  Check the source of the calories. Make sure the food you are eating is high in vitamins and protein and low in saturated and trans fats. Shopping  Read nutrition labels while you shop. This will help you make healthy decisions before you decide to purchase your food.  Make a grocery list and stick to it. Cooking  Try to cook your favorite foods in a healthier way. For example, try baking instead of frying.  Use low-fat dairy products. Meal planning  Use more fruits and vegetables. Half of your plate should be fruits and vegetables.  Include lean proteins like poultry and fish. How do I count calories when eating out?  Ask for smaller portion sizes.  Consider sharing an entree and sides instead of getting your own entree.  If you get your own entree, eat only half. Ask for a box at the beginning of your meal and put the rest of your entree in it so you are not tempted to eat it.  If calories are listed on the menu, choose the lower calorie options.  Choose dishes that include vegetables, fruits, whole grains, low-fat dairy products, and lean protein.  Choose items that are boiled, broiled, grilled, or steamed. Stay away from items that are buttered, battered, fried, or served with cream sauce. Items labeled "crispy" are usually fried, unless stated otherwise.  Choose water, low-fat milk, unsweetened iced tea, or other drinks without added sugar. If you want an alcoholic beverage, choose a lower calorie option such as a glass of wine or light beer.  Ask for dressings, sauces, and syrups on the side. These are usually high in calories, so you should limit the amount you eat.  If you want a salad, choose a garden salad and ask for grilled meats. Avoid  extra toppings like bacon, cheese, or fried items. Ask for the dressing on the side, or ask for olive oil  and vinegar or lemon to use as dressing.  Estimate how many servings of a food you are given. For example, a serving of cooked rice is  cup or about the size of half a baseball. Knowing serving sizes will help you be aware of how much food you are eating at restaurants. The list below tells you how big or small some common portion sizes are based on everyday objects: ? 1 oz--4 stacked dice. ? 3 oz--1 deck of cards. ? 1 tsp--1 die. ? 1 Tbsp-- a ping-pong ball. ? 2 Tbsp--1 ping-pong ball. ?  cup-- baseball. ? 1 cup--1 baseball. Summary  Calorie counting means keeping track of how many calories you eat and drink each day. If you eat fewer calories than your body needs, you should lose weight.  A healthy amount of weight to lose per week is usually 1-2 lb (0.5-0.9 kg). This usually means reducing your daily calorie intake by 500-750 calories.  The number of calories in a food can be found on a Nutrition Facts label. If a food does not have a Nutrition Facts label, try to look up the calories online or ask your dietitian for help.  Use your calories on foods and drinks that will fill you up, and not on foods and drinks that will leave you hungry.  Use smaller plates, glasses, and bowls to prevent overeating. This information is not intended to replace advice given to you by your health care provider. Make sure you discuss any questions you have with your health care provider. Document Revised: 08/29/2018 Document Reviewed: 11/09/2016 Elsevier Patient Education  2020 [employer].

## 2020-08-04 NOTE — Progress Notes (Signed)
Date:  08/04/2020   Name:  Kenneth Craig   DOB:  Dec 07, 1991   MRN:  [id number]   Chief Complaint: Establish Care (can he take cold and flu meds with predn. and amox.)  Patient is a 29 year old male who presents for a comprehensive physical exam. The patient reports the following problems: overweight. Health maintenance has been reviewed up to date.   Lab Results  Component Value Date   CREATININE 0.87 07/27/2019   BUN 16 07/27/2019   NA 135 07/27/2019   K 4.3 07/27/2019   CL 102 07/27/2019   CO2 24 07/27/2019   No results found for: CHOL, HDL, LDLCALC, LDLDIRECT, TRIG, CHOLHDL No results found for: TSH No results found for: HGBA1C Lab Results  Component Value Date   WBC 13.0 (H) 07/27/2019   HGB 14.0 07/27/2019   HCT 42.4 07/27/2019   MCV 83.6 07/27/2019   PLT 542 (H) 07/27/2019   Lab Results  Component Value Date   ALT 71 (H) 07/27/2019   AST 25 07/27/2019   ALKPHOS 42 07/27/2019   BILITOT 0.6 07/27/2019     Review of Systems  Constitutional: Negative for chills and fever.  HENT: Negative for drooling, ear discharge, ear pain and sore throat.   Respiratory: Negative for cough, shortness of breath and wheezing.   Cardiovascular: Negative for chest pain, palpitations and leg swelling.  Gastrointestinal: Negative for abdominal pain, blood in stool, constipation, diarrhea and nausea.  Endocrine: Negative for polydipsia.  Genitourinary: Negative for dysuria, frequency, hematuria and urgency.  Musculoskeletal: Negative for back pain, myalgias and neck pain.  Skin: Negative for rash.  Allergic/Immunologic: Negative for environmental allergies.  Neurological: Negative for dizziness and headaches.  Hematological: Does not bruise/bleed easily.  Psychiatric/Behavioral: Negative for suicidal ideas. The patient is not nervous/anxious.     Patient Active Problem List   Diagnosis Date Noted  . Meningitis 07/22/2019    No Known Allergies  Past Surgical History:    Procedure Laterality Date  . TONSILLECTOMY      Social History   Tobacco Use  . Smoking status: Never Smoker  . Smokeless tobacco: Never Used  Vaping Use  . Vaping Use: Never used  Substance Use Topics  . Alcohol use: Yes    Comment: ocassionally  . Drug use: Yes    Types: Marijuana     Medication list has been reviewed and updated.  Current Meds  Medication Sig  . amoxicillin-clavulanate (AUGMENTIN) 875-125 MG tablet Take by mouth.  . Biotin 10 MG TABS Take 10 mg by mouth daily.  Marland Kitchen ibuprofen (ADVIL) 200 MG tablet Take 200 mg by mouth every 6 (six) hours as needed.  . Nutritional Supplements (COLD AND FLU PO) Take by mouth as needed.  . predniSONE (DELTASONE) 20 MG tablet Take 20 mg by mouth 2 (two) times daily.  . promethazine-codeine (PHENERGAN WITH CODEINE) 6.25-10 MG/5ML syrup Take by mouth.    PHQ 2/9 Scores 08/04/2020  PHQ - 2 Score 2  PHQ- 9 Score 6    GAD 7 : Generalized Anxiety Score 08/04/2020  Nervous, Anxious, on Edge 1  Control/stop worrying 1  Worry too much - different things 1  Trouble relaxing 0  Restless 0  Easily annoyed or irritable 1  Afraid - awful might happen 1  Total GAD 7 Score 5  Anxiety Difficulty Not difficult at all    BP Readings from Last 3 Encounters:  08/04/20 (!) 180/120  07/27/19 (!) 151/70  07/21/19 140/67  Physical Exam Vitals and nursing note reviewed.  HENT:     Head: Normocephalic.     Right Ear: Tympanic membrane, ear canal and external ear normal.     Left Ear: Tympanic membrane, ear canal and external ear normal.     Nose: Nose normal.  Eyes:     General: No scleral icterus.       Right eye: No discharge.        Left eye: No discharge.     Conjunctiva/sclera: Conjunctivae normal.     Pupils: Pupils are equal, round, and reactive to light.  Neck:     Thyroid: No thyromegaly.     Vascular: No JVD.     Trachea: No tracheal deviation.  Cardiovascular:     Rate and Rhythm: Normal rate and regular rhythm.      Heart sounds: Normal heart sounds. No murmur heard.  No friction rub. No gallop.   Pulmonary:     Effort: No respiratory distress.     Breath sounds: Normal breath sounds. No wheezing, rhonchi or rales.  Chest:     Chest wall: No tenderness.  Abdominal:     General: Bowel sounds are normal.     Palpations: Abdomen is soft. There is no mass.     Tenderness: There is no abdominal tenderness. There is no guarding or rebound.  Musculoskeletal:        General: No tenderness. Normal range of motion.     Cervical back: Normal range of motion and neck supple.  Lymphadenopathy:     Cervical: No cervical adenopathy.  Skin:    General: Skin is warm.     Findings: No rash.  Neurological:     Mental Status: He is alert and oriented to person, place, and time.     Cranial Nerves: No cranial nerve deficit.     Deep Tendon Reflexes: Reflexes are normal and symmetric.     Wt Readings from Last 3 Encounters:  08/04/20 (!) 360 lb (163.3 kg)  07/22/19 (!) 340 lb (154.2 kg)  07/21/19 (!) 341 lb 11.4 oz (155 kg)    BP (!) 180/120   Pulse 96   Wt (!) 360 lb (163.3 kg)   BMI 53.16 kg/m    Assessment and Plan: 1. Establishing care with new doctor, encounter for Patient establish care with new physician.  Patient seeking to be referred for weight loss.  2. BMI 50.0-59.9, adult (HCC) Chronic.  Persistent.  Gradually worsening.  Patient has not really tried dieting and was looking for a shortcut with pills.  This was described to be at odds with his elevated blood pressure information on dieting was given and patient will be seen back for recheck in 4 to 6 weeks to see if there are results with the weight.  It was also noted that patient had elevated blood pressure and patient was started on medication.. - POCT Glucose (CBG)  3. Essential hypertension New onset.  Persistent.  Will initiate hydrochlorothiazide and low-sodium diet and will recheck in 6 weeks. - hydrochlorothiazide  (HYDRODIURIL) 12.5 MG tablet; Take 1 tablet (12.5 mg total) by mouth daily.  Dispense: 90 tablet; Refill: 0  4. Dyshidrotic eczema Discussed with patient next that will be dermatology.

## 2020-08-14 ENCOUNTER — Other Ambulatory Visit: Payer: Self-pay

## 2020-08-14 ENCOUNTER — Encounter: Payer: Self-pay | Admitting: Emergency Medicine

## 2020-08-14 ENCOUNTER — Ambulatory Visit
Admission: EM | Admit: 2020-08-14 | Discharge: 2020-08-14 | Disposition: A | Discharge status: Home / Self Care | Payer: 59 | Attending: Family Medicine | Admitting: Family Medicine

## 2020-08-14 DIAGNOSIS — Z20822 Contact with and (suspected) exposure to covid-19: Secondary | ICD-10-CM | POA: Diagnosis not present

## 2020-08-14 DIAGNOSIS — B8789 Myiasis of other sites: Secondary | ICD-10-CM | POA: Diagnosis not present

## 2020-08-14 DIAGNOSIS — J029 Acute pharyngitis, unspecified: Secondary | ICD-10-CM | POA: Insufficient documentation

## 2020-08-14 LAB — GROUP A STREP BY PCR: Group A Strep by PCR: NOT DETECTED

## 2020-08-14 MED ORDER — KETOROLAC TROMETHAMINE 10 MG PO TABS: 10.0000 mg | ORAL_TABLET | Freq: Four times a day (QID) | ORAL | 0 refills | Status: DC | PRN

## 2020-08-14 NOTE — ED Triage Notes (Signed)
Pt c/o sore throat started about 2 days ago. He states he had a fever once (100) but has not returned. He states he had a dry cough and nasal congestion but has resolved. He has had his first covid vaccine.

## 2020-08-14 NOTE — ED Provider Notes (Signed)
[facility]    CSN: [id number] Arrival date & time: 08/14/20  0911      History   Chief Complaint Chief Complaint  Patient presents with  . Sore Throat   HPI  29 year old male presents with the above complaint.  Patient states that he has had ongoing symptoms for the past few weeks.  He states that he believes he got an illness from his son who has been sick.  He has had runny nose, cough, congestion.  This seemed to improve.  He has had a recent fever of 100.8.  He states that this was a couple of days ago.  Has now resolved.  Abdominal complaint at this time is sore throat.  Painful swallowing.  He has received his Covid vaccine.  Pain 2/10 in severity.  No relieving factors.  No other associated symptoms.  No other complaints.  Patient Active Problem List   Diagnosis Date Noted  . Meningitis 07/22/2019   Past Surgical History:  Procedure Laterality Date  . TONSILLECTOMY     Home Medications    Prior to Admission medications   Medication Sig Start Date End Date Taking? Authorizing Provider  Biotin 10 MG TABS Take 10 mg by mouth daily.   Yes [provider]  hydrochlorothiazide (HYDRODIURIL) 12.5 MG tablet Take 1 tablet (12.5 mg total) by mouth daily. 08/04/20  Yes Duanne Limerick, MD  ketorolac (TORADOL) 10 MG tablet Take 1 tablet (10 mg total) by mouth every 6 (six) hours as needed for moderate pain or severe pain. 08/14/20   Tommie Sams, DO    Family History Family History  Problem Relation Age of Onset  . Heart disease Father     Social History Social History   Tobacco Use  . Smoking status: Never Smoker  . Smokeless tobacco: Never Used  Vaping Use  . Vaping Use: Never used  Substance Use Topics  . Alcohol use: Yes    Comment: ocassionally  . Drug use: Yes    Types: Marijuana     Allergies   Patient has no known allergies.   Review of Systems Review of Systems Per HPI  Physical Exam Triage Vital Signs ED Triage Vitals    Enc Vitals Group     BP 08/14/20 0945 (!) 169/101     Pulse Rate 08/14/20 0945 88     Resp 08/14/20 0945 18     Temp 08/14/20 0945 98.9 F (37.2 C)     Temp Source 08/14/20 0945 Oral     SpO2 08/14/20 0945 99 %     Weight 08/14/20 0941 (!) 360 lb 0.2 oz (163.3 kg)     Height 08/14/20 0941 5\' 9"  (1.753 m)     Head Circumference --      Peak Flow --      Pain Score 08/14/20 0941 2     Pain Loc --      Pain Edu? --      Excl. in GC? --    Updated Vital Signs BP (!) 169/101 (BP Location: Right Arm)   Pulse 88   Temp 98.9 F (37.2 C) (Oral)   Resp 18   Ht 5\' 9"  (1.753 m)   Wt (!) 163.3 kg   SpO2 99%   BMI 53.16 kg/m   Visual Acuity Right Eye Distance:   Left Eye Distance:   Bilateral Distance:    Right Eye Near:   Left Eye Near:    Bilateral Near:  Physical Exam Vitals and nursing note reviewed.  Constitutional:      General: He is not in acute distress.    Appearance: Normal appearance. He is obese. He is not ill-appearing.  HENT:     Head: Normocephalic and atraumatic.     Right Ear: Tympanic membrane normal.     Left Ear: Tympanic membrane normal.     Mouth/Throat:     Pharynx: Posterior oropharyngeal erythema present. No oropharyngeal exudate.  Cardiovascular:     Rate and Rhythm: Normal rate and regular rhythm.     Heart sounds: No murmur heard.   Pulmonary:     Effort: Pulmonary effort is normal.     Breath sounds: Normal breath sounds. No wheezing, rhonchi or rales.  Neurological:     Mental Status: He is alert.  Psychiatric:        Mood and Affect: Mood normal.        Behavior: Behavior normal.    UC Treatments / Results  Labs (all labs ordered are listed, but only abnormal results are displayed) Labs Reviewed  GROUP A STREP BY PCR  SARS CORONAVIRUS 2 (TAT 6-24 HRS)    EKG   Radiology No results found.  Procedures Procedures (including critical care time)  Medications Ordered in UC Medications - No data to display  Initial  Impression / Assessment and Plan / UC Course  I have reviewed the triage vital signs and the nursing notes.  Pertinent labs & imaging results that were available during my care of the patient were reviewed by me and considered in my medical decision making (see chart for details).    29 year old male presents with viral pharyngitis.  He has had ongoing respiratory symptoms as well.  And recent fever.  Awaiting Covid test result.  Toradol as needed for pain.  Supportive care.  Final Clinical Impressions(s) / UC Diagnoses   Final diagnoses:  Viral pharyngitis   Discharge Instructions   None    ED Prescriptions    Medication Sig Dispense Auth. Provider   ketorolac (TORADOL) 10 MG tablet Take 1 tablet (10 mg total) by mouth every 6 (six) hours as needed for moderate pain or severe pain. 20 tablet Tommie Sams, DO     PDMP not reviewed this encounter.   Tommie Sams, [place] 08/14/20 1110

## 2020-08-15 LAB — SARS CORONAVIRUS 2 (TAT 6-24 HRS): SARS Coronavirus 2: NEGATIVE

## 2020-09-06 ENCOUNTER — Encounter: Payer: Self-pay | Admitting: Emergency Medicine

## 2020-09-06 ENCOUNTER — Ambulatory Visit
Admission: EM | Admit: 2020-09-06 | Discharge: 2020-09-06 | Disposition: A | Discharge status: Home / Self Care | Payer: 59 | Attending: Family Medicine | Admitting: Family Medicine

## 2020-09-06 ENCOUNTER — Other Ambulatory Visit: Payer: Self-pay

## 2020-09-06 DIAGNOSIS — M7918 Myalgia, other site: Secondary | ICD-10-CM | POA: Diagnosis present

## 2020-09-06 LAB — URINALYSIS, COMPLETE (UACMP) WITH MICROSCOPIC
Bacteria, UA: NONE SEEN
Bilirubin Urine: NEGATIVE
Glucose, UA: NEGATIVE mg/dL
Hgb urine dipstick: NEGATIVE
Ketones, ur: NEGATIVE mg/dL
Leukocytes,Ua: NEGATIVE
Nitrite: NEGATIVE
Protein, ur: NEGATIVE mg/dL
Specific Gravity, Urine: 1.02 (ref 1.005–1.030)
Squamous Epithelial / LPF: NONE SEEN (ref 0–5)
pH: 6 (ref 5.0–8.0)

## 2020-09-06 MED ORDER — MELOXICAM 15 MG PO TABS: 15.0000 mg | ORAL_TABLET | Freq: Every day | ORAL | 0 refills | Status: DC | PRN

## 2020-09-06 MED ORDER — TIZANIDINE HCL 4 MG PO TABS: 4.0000 mg | ORAL_TABLET | Freq: Three times a day (TID) | ORAL | 0 refills | Status: DC | PRN

## 2020-09-06 NOTE — Discharge Instructions (Signed)
This is muscular.  Apply heat.  Medications as directed.  Take care  Dr. Adriana Simas

## 2020-09-06 NOTE — ED Triage Notes (Signed)
Patient c/o left side back pain that started 2 days ago. He denies injury.

## 2020-09-06 NOTE — ED Provider Notes (Signed)
[facility]    CSN: [id number] Arrival date & time: 09/06/20  0816      History   Chief Complaint Chief Complaint  Patient presents with   Back Pain   HPI  29 year old male presents with back pain.  Left low back.  Started 2 days ago.  No injury.  Pain 6/10 in severity.  Worse with activity/movement.  No relieving factors.  He has taken Tylenol and Motrin x1 without relief.  No other medications or interventions tried.  No radicular symptoms.  No other complaints or concerns at this time.  Patient Active Problem List   Diagnosis Date Noted   Meningitis 07/22/2019   Past Surgical History:  Procedure Laterality Date   TONSILLECTOMY      Home Medications    Prior to Admission medications   Medication Sig Start Date End Date Taking? Authorizing Provider  Biotin 10 MG TABS Take 10 mg by mouth daily.   Yes [provider]  hydrochlorothiazide (HYDRODIURIL) 12.5 MG tablet Take 1 tablet (12.5 mg total) by mouth daily. 08/04/20  Yes Duanne Limerick, MD  meloxicam (MOBIC) 15 MG tablet Take 1 tablet (15 mg total) by mouth daily as needed. 09/06/20   Tommie Sams, DO  tiZANidine (ZANAFLEX) 4 MG tablet Take 1 tablet (4 mg total) by mouth every 8 (eight) hours as needed for muscle spasms. 09/06/20   Tommie Sams, DO    Family History Family History  Problem Relation Age of Onset   Heart disease Father     Social History Social History   Tobacco Use   Smoking status: Never Smoker   Smokeless tobacco: Never Used  Building services engineer Use: Never used  Substance Use Topics   Alcohol use: Yes    Comment: ocassionally   Drug use: Yes    Types: Marijuana     Allergies   Patient has no known allergies.   Review of Systems Review of Systems  Constitutional: Negative.   Genitourinary: Negative.   Musculoskeletal: Positive for back pain.    Physical Exam Triage Vital Signs ED Triage Vitals  Enc Vitals Group     BP 09/06/20 0902 (!)  151/107     Pulse Rate 09/06/20 0902 95     Resp 09/06/20 0902 18     Temp 09/06/20 0902 98.6 F (37 C)     Temp Source 09/06/20 0902 Oral     SpO2 09/06/20 0902 100 %     Weight 09/06/20 0852 (!) 360 lb (163.3 kg)     Height 09/06/20 0852 5\' 9"  (1.753 m)     Head Circumference --      Peak Flow --      Pain Score 09/06/20 0852 0     Pain Loc --      Pain Edu? --      Excl. in GC? --    No data found.  Updated Vital Signs BP (!) 151/107 (BP Location: Right Arm)    Pulse 95    Temp 98.6 F (37 C) (Oral)    Resp 18    Ht 5\' 9"  (1.753 m)    Wt (!) 163.3 kg    SpO2 100%    BMI 53.16 kg/m   Visual Acuity Right Eye Distance:   Left Eye Distance:   Bilateral Distance:    Right Eye Near:   Left Eye Near:    Bilateral Near:     Physical Exam Constitutional:  General: He is not in acute distress.    Appearance: Normal appearance. He is obese. He is not ill-appearing.  HENT:     Head: Normocephalic and atraumatic.  Eyes:     General:        Right eye: No discharge.        Left eye: No discharge.     Conjunctiva/sclera: Conjunctivae normal.  Cardiovascular:     Rate and Rhythm: Normal rate and regular rhythm.     Heart sounds: No murmur heard.   Pulmonary:     Effort: Pulmonary effort is normal.     Breath sounds: Normal breath sounds. No wheezing, rhonchi or rales.  Musculoskeletal:     Comments: Patient with tenderness of the left paraspinal musculature of the lumbar spine as well as the left flank.  Neurological:     Mental Status: He is alert.  Psychiatric:        Mood and Affect: Mood normal.        Behavior: Behavior normal.    UC Treatments / Results  Labs (all labs ordered are listed, but only abnormal results are displayed) Labs Reviewed  URINALYSIS, COMPLETE (UACMP) WITH MICROSCOPIC    EKG   Radiology No results found.  Procedures Procedures (including critical care time)  Medications Ordered in UC Medications - No data to  display  Initial Impression / Assessment and Plan / UC Course  I have reviewed the triage vital signs and the nursing notes.  Pertinent labs & imaging results that were available during my care of the patient were reviewed by me and considered in my medical decision making (see chart for details).    29 year old male presents with musculoskeletal pain.  Treating with meloxicam and Zanaflex.  Supportive care.  Work note given.  Final Clinical Impressions(s) / UC Diagnoses   Final diagnoses:  Musculoskeletal pain     Discharge Instructions     This is muscular.  Apply heat.  Medications as directed.  Take care  Dr. Adriana Simas    ED Prescriptions    Medication Sig Dispense Auth. Provider   meloxicam (MOBIC) 15 MG tablet Take 1 tablet (15 mg total) by mouth daily as needed. 30 tablet Raianna Slight G, DO   tiZANidine (ZANAFLEX) 4 MG tablet Take 1 tablet (4 mg total) by mouth every 8 (eight) hours as needed for muscle spasms. 30 tablet Tommie Sams, DO     PDMP not reviewed this encounter.   Tommie Sams, [place] 09/06/20 1014

## 2020-09-13 ENCOUNTER — Other Ambulatory Visit: Payer: Self-pay | Admitting: Family Medicine

## 2020-09-15 ENCOUNTER — Ambulatory Visit: Payer: 59 | Admitting: Family Medicine

## 2020-10-03 ENCOUNTER — Other Ambulatory Visit: Payer: Self-pay | Admitting: Family Medicine

## 2020-10-30 ENCOUNTER — Other Ambulatory Visit: Payer: Self-pay | Admitting: Family Medicine

## 2020-10-30 DIAGNOSIS — I10 Essential (primary) hypertension: Secondary | ICD-10-CM

## 2020-10-30 NOTE — Telephone Encounter (Signed)
Requested Prescriptions  Pending Prescriptions Disp Refills  . hydrochlorothiazide (HYDRODIURIL) 12.5 MG tablet [Pharmacy Med Name: HYDROCHLOROTHIAZIDE 12.5MG  TABLETS] 90 tablet 0    Sig: TAKE 1 TABLET(12.5 MG) BY MOUTH DAILY     Cardiovascular: Diuretics - Thiazide Failed - 10/30/2020  8:07 AM      Failed - Ca in normal range and within 360 days    Calcium  Date Value Ref Range Status  07/27/2019 8.7 (L) 8.9 - 10.3 mg/dL Final         Failed - Cr in normal range and within 360 days    Creatinine, Ser  Date Value Ref Range Status  07/27/2019 0.87 0.61 - 1.24 mg/dL Final         Failed - K in normal range and within 360 days    Potassium  Date Value Ref Range Status  07/27/2019 4.3 3.5 - 5.1 mmol/L Final         Failed - Na in normal range and within 360 days    Sodium  Date Value Ref Range Status  07/27/2019 135 135 - 145 mmol/L Final         Failed - Last BP in normal range    BP Readings from Last 1 Encounters:  09/06/20 (!) 151/107         Passed - Valid encounter within last 6 months    Recent Outpatient Visits          2 months ago Establishing care with new doctor, encounter for   [facility] Duanne Limerick, MD

## 2021-06-21 IMAGING — DX PORTABLE CHEST - 1 VIEW
1 series · 1 of 1 positions shown · non-contrast
Comparison: None.

CLINICAL DATA: Worsening headaches over the last 5 days.
Dehydration. Reported recent negative [id number] testing.

EXAM:
PORTABLE CHEST 1 VIEW

[chest ap]
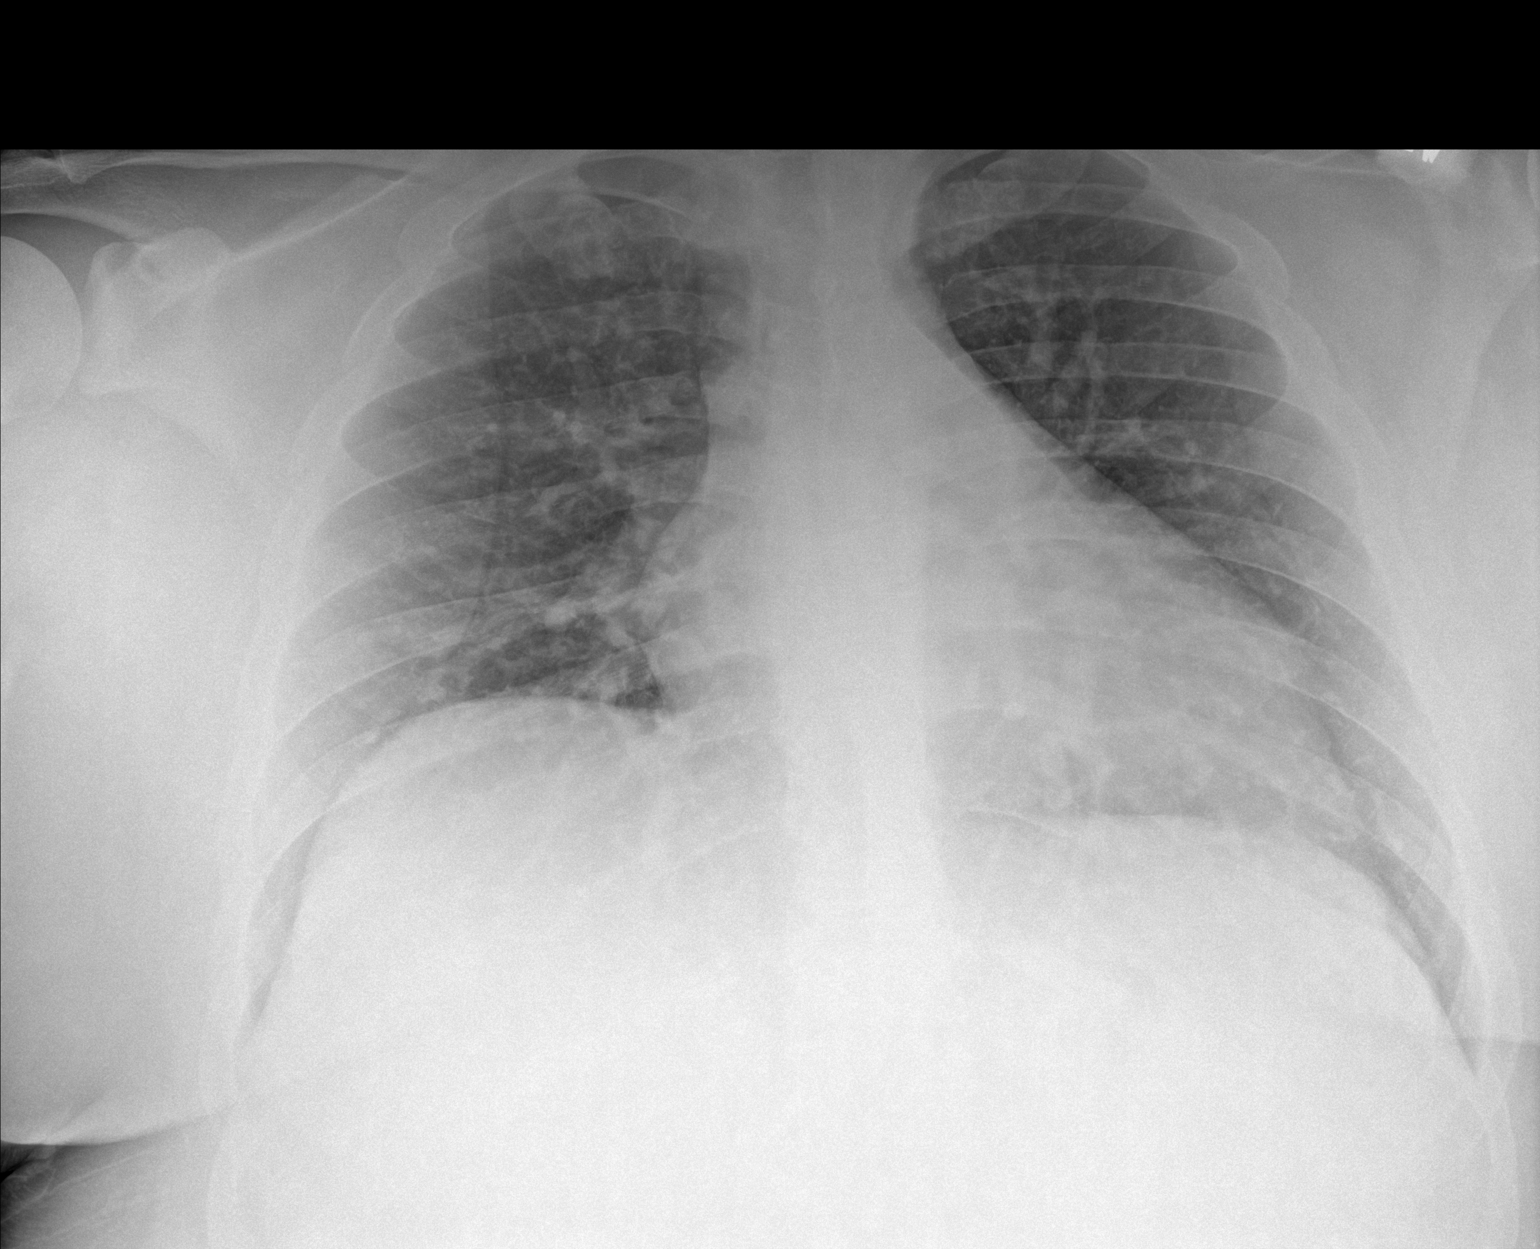

[1 of 1 positions shown; findings below may reference images not displayed]

FINDINGS: 3398 hours. Lordotic positioning. The heart size and mediastinal
contours are normal. The lungs are clear. There is no pleural
effusion or pneumothorax. No acute osseous findings are identified.
IMPRESSION: No active cardiopulmonary process.

## 2021-09-24 ENCOUNTER — Emergency Department
Admission: EM | Admit: 2021-09-24 | Discharge: 2021-09-24 | Disposition: A | Discharge status: Home / Self Care | Payer: 59 | Attending: Emergency Medicine | Admitting: Emergency Medicine

## 2021-09-24 ENCOUNTER — Other Ambulatory Visit: Payer: Self-pay

## 2021-09-24 ENCOUNTER — Emergency Department: Payer: 59

## 2021-09-24 DIAGNOSIS — R109 Unspecified abdominal pain: Secondary | ICD-10-CM | POA: Diagnosis present

## 2021-09-24 DIAGNOSIS — N200 Calculus of kidney: Secondary | ICD-10-CM | POA: Diagnosis not present

## 2021-09-24 LAB — URINALYSIS, ROUTINE W REFLEX MICROSCOPIC
Bilirubin Urine: NEGATIVE
Glucose, UA: NEGATIVE mg/dL
Ketones, ur: NEGATIVE mg/dL
Leukocytes,Ua: NEGATIVE
Nitrite: NEGATIVE
Protein, ur: 100 mg/dL — AB
RBC / HPF: >50 RBC/hpf — ABNORMAL HIGH (ref 0–5)
Specific Gravity, Urine: 1.031 — ABNORMAL HIGH (ref 1.005–1.030)
pH: 5 (ref 5.0–8.0)

## 2021-09-24 LAB — CBC
HCT: 46.6 % (ref 39.0–52.0)
Hemoglobin: 16 g/dL (ref 13.0–17.0)
MCH: 28.4 pg (ref 26.0–34.0)
MCHC: 34.3 g/dL (ref 30.0–36.0)
MCV: 82.6 fL (ref 80.0–100.0)
Platelets: 387 10*3/uL (ref 150–400)
RBC: 5.64 MIL/uL (ref 4.22–5.81)
RDW: 12.8 % (ref 11.5–15.5)
WBC: 13.6 10*3/uL — ABNORMAL HIGH (ref 4.0–10.5)
nRBC: 0 % (ref 0.0–0.2)

## 2021-09-24 LAB — BASIC METABOLIC PANEL
Anion gap: 11 (ref 5–15)
BUN: 17 mg/dL (ref 6–20)
CO2: 23 mmol/L (ref 22–32)
Calcium: 9.3 mg/dL (ref 8.9–10.3)
Chloride: 103 mmol/L (ref 98–111)
Creatinine, Ser: 1.25 mg/dL — ABNORMAL HIGH (ref 0.61–1.24)
GFR, Estimated: >60 mL/min (ref >60)
Glucose, Bld: 111 mg/dL — ABNORMAL HIGH (ref 70–99)
Potassium: 4.2 mmol/L (ref 3.5–5.1)
Sodium: 137 mmol/L (ref 135–145)

## 2021-09-24 MED ORDER — OXYCODONE HCL 5 MG PO TABS: 5.0000 mg | ORAL_TABLET | Freq: Four times a day (QID) | ORAL | 0 refills | Status: AC | PRN

## 2021-09-24 MED ORDER — HYDROMORPHONE HCL 1 MG/ML IJ SOLN
0.5000 mg | Freq: Once | INTRAMUSCULAR | Status: AC
  Administered 2021-09-24: 0.5 mg via INTRAVENOUS
  Filled 2021-09-24: qty 1

## 2021-09-24 MED ORDER — ONDANSETRON 4 MG PO TBDP: 4.0000 mg | ORAL_TABLET | Freq: Three times a day (TID) | ORAL | 0 refills | Status: DC | PRN

## 2021-09-24 MED ORDER — KETOROLAC TROMETHAMINE 30 MG/ML IJ SOLN
15.0000 mg | Freq: Once | INTRAMUSCULAR | Status: AC
  Administered 2021-09-24: 15 mg via INTRAVENOUS
  Filled 2021-09-24: qty 1

## 2021-09-24 MED ORDER — ONDANSETRON HCL 4 MG/2ML IJ SOLN
4.0000 mg | Freq: Once | INTRAMUSCULAR | Status: AC
  Administered 2021-09-24: 4 mg via INTRAVENOUS
  Filled 2021-09-24: qty 2

## 2021-09-24 MED ORDER — TAMSULOSIN HCL 0.4 MG PO CAPS: 0.4000 mg | ORAL_CAPSULE | Freq: Every day | ORAL | 0 refills | Status: AC

## 2021-09-24 MED ORDER — SODIUM CHLORIDE 0.9 % IV BOLUS
1000.0000 mL | Freq: Once | INTRAVENOUS | Status: AC
  Administered 2021-09-24: 1000 mL via INTRAVENOUS

## 2021-09-24 MED ORDER — OXYCODONE HCL 5 MG PO TABS
5.0000 mg | ORAL_TABLET | Freq: Once | ORAL | Status: AC
  Administered 2021-09-24: 5 mg via ORAL
  Filled 2021-09-24: qty 1

## 2021-09-24 NOTE — ED Provider Notes (Signed)
[facility] Emergency Department Provider Note  ____________________________________________   Event Date/Time   First MD Initiated Contact with Patient 09/24/21 [id number]     (approximate)  I have reviewed the triage vital signs    HISTORY  Chief Complaint Flank Pain    HPI Kenneth Craig is a 30 y.o. male who presents with flank pain.  Patient reports flank pain that started today.  It is on his right flank.  Does not report any burning when pees but just feeling like he has to pee and then can get the urine out.  Denies this ever happening previously.  No history of kidney stones.  Pain is currently severe, constant, took some Dulcolax and had a bowel movement but not making it any better.  Nothing makes it worse   Medical: Otherwise healthy  Patient Active Problem List   Diagnosis Date Noted   Meningitis 07/22/2019    Past Surgical History:  Procedure Laterality Date   TONSILLECTOMY      Prior to Admission medications   Medication Sig Start Date End Date Taking? Authorizing Provider  Biotin 10 MG TABS Take 10 mg by mouth daily.    [provider]  hydrochlorothiazide (HYDRODIURIL) 12.5 MG tablet TAKE 1 TABLET(12.5 MG) BY MOUTH DAILY 10/30/20   Duanne Limerick, MD  meloxicam (MOBIC) 15 MG tablet Take 1 tablet (15 mg total) by mouth daily as needed. 09/06/20   Tommie Sams, DO  tiZANidine (ZANAFLEX) 4 MG tablet Take 1 tablet (4 mg total) by mouth every 8 (eight) hours as needed for muscle spasms. 09/06/20   Tommie Sams, DO    Allergies Patient has no known allergies.  Family History  Problem Relation Age of Onset   Heart disease Father     Social History Social History   Tobacco Use   Smoking status: Never   Smokeless tobacco: Never  Vaping Use   Vaping Use: Never used  Substance Use Topics   Alcohol use: Yes    Comment: ocassionally   Drug use: Yes    Types: Marijuana      Review of Systems Constitutional: No  fever/chills Eyes: No visual changes. ENT: No sore throat. Cardiovascular: Denies chest pain. Respiratory: no SOB. No cough Gastrointestinal: No abdominal pain.  No nausea, no vomiting.  No diarrhea.  No constipation. Genitourinary: no severe blood in urine Musculoskeletal: + flank pain Skin: Negative for rash. Neurological: Negative for headaches, focal weakness or numbness. All other ROS negative ____________________________________________   PHYSICAL EXAM:  VITAL SIGNS: ED Triage Vitals  Enc Vitals Group     BP 09/24/21 0121 (!) 153/107     Pulse Rate 09/24/21 0121 89     Resp 09/24/21 0121 18     Temp 09/24/21 0121 97.8 F (36.6 C)     Temp Source 09/24/21 0121 Oral     SpO2 09/24/21 0121 96 %     Weight 09/24/21 0125 (!) 370 lb (167.8 kg)     Height 09/24/21 0125 5\' 9"  (1.753 m)     Head Circumference --      Peak Flow --      Pain Score 09/24/21 0125 10     Pain Loc --      Pain Edu? --      Excl. in [place]? --     Constitutional: Alert and oriented. Discomfort noted  Eyes: Conjunctivae are normal. EOMI. Head: Atraumatic. Nose: No congestion/rhinnorhea. Mouth/Throat: Mucous membranes are moist.   Neck:  No stridor. Trachea Midline. FROM Cardiovascular: Normal rate, regular rhythm.  Good peripheral circulation. Respiratory: no audible stridor, work of breathing  Gastrointestinal: Soft and nontender. No distention.  Musculoskeletal: No lower extremity tenderness nor edema.  No joint effusions. Neurologic:  Normal speech and language. No gross focal neurologic deficits are appreciated.  Skin:  Skin is warm, dry and intact. No rash noted. Psychiatric: Mood and affect are normal. Speech and behavior are normal. GU: Deferred  Flank tenderness.  ____________________________________________   LABS (all labs ordered are listed, but only abnormal results are displayed)  Labs Reviewed  BASIC METABOLIC PANEL - Abnormal; Notable for the following components:      Result  Value   Glucose, Bld 111 (*)    Creatinine, Ser 1.25 (*)    All other components within normal limits  CBC - Abnormal; Notable for the following components:   WBC 13.6 (*)    All other components within normal limits  URINALYSIS, ROUTINE W REFLEX MICROSCOPIC - Abnormal; Notable for the following components:   Color, Urine YELLOW (*)    APPearance CLOUDY (*)    Specific Gravity, Urine 1.031 (*)    Hgb urine dipstick LARGE (*)    Protein, ur 100 (*)    RBC / HPF >50 (*)    Bacteria, UA RARE (*)    All other components within normal limits   ____________________________________________  RADIOLOGY  Official radiology report(s): CT Renal Stone Study  Result Date: 09/24/2021 CLINICAL DATA:  Right flank pain. EXAM: CT ABDOMEN AND PELVIS WITHOUT CONTRAST TECHNIQUE: Multidetector CT imaging of the abdomen and pelvis was performed following the standard protocol without IV contrast. COMPARISON:  None. FINDINGS: Lower chest: No acute abnormality. Hepatobiliary: There is diffuse fatty infiltration of the liver parenchyma. No focal liver abnormality is seen. No gallstones, gallbladder wall thickening, or biliary dilatation. Pancreas: Unremarkable. No pancreatic ductal dilatation or surrounding inflammatory changes. Spleen: Normal in size without focal abnormality. Adrenals/Urinary Tract: Adrenal glands are unremarkable. Kidneys are normal in size, without focal lesions. An 8 mm nonobstructing renal stone is seen within the mid right kidney. A 7 mm obstructing renal stone is seen within the distal right ureter, near the right UVJ. There is moderate to marked severity right-sided hydronephrosis and hydroureter with mild right-sided perinephric inflammatory fat stranding. Bladder is unremarkable. Stomach/Bowel: Stomach is within normal limits. Appendix appears normal. No evidence of bowel wall thickening, distention, or inflammatory changes. Vascular/Lymphatic: No significant vascular findings are present.  No enlarged abdominal or pelvic lymph nodes. Reproductive: Prostate is unremarkable. Other: No abdominal wall hernia or abnormality. No abdominopelvic ascites. Musculoskeletal: Loss of vertebral body height is seen within the T10, T11 and T12 vertebral bodies. This is of indeterminate age. IMPRESSION: 1. 7 mm obstructing renal stone within the distal right ureter. 2. Nonobstructing 8 mm renal stone within the mid right kidney. 3. Hepatic steatosis. Electronically Signed   By: Aram Candela M.D.   On: 09/24/2021 03:34    ____________________________________________   PROCEDURES  Procedure(s) performed (including Critical Care):  Procedures   ____________________________________________   INITIAL IMPRESSION / ASSESSMENT AND PLAN / ED COURSE  Kenneth Craig was evaluated in Emergency Department on 09/24/2021 for the symptoms described in the history of present illness. He was evaluated in the context of the global COVID-19 pandemic, which necessitated consideration that the patient might be at risk for infection with the SARS-CoV-2 virus that causes COVID-19. Institutional protocols and algorithms that pertain to the evaluation of patients at risk for  COVID-19 are in a state of rapid change based on information released by regulatory bodies including the CDC and federal and state organizations. These policies and algorithms were followed during the patient's care in the ED.     Pt presents with flank pain.  Suspect this is most likely kidney stone. Will get labs to evaluate for electrolyte abnormalities/AKI.  CT scan ordered to evaluate for kidney stone.  Also consider appendicitis vs SBO although less likely given description of pain.  Will get UA to evaluate for UTI/pyelo.   CT confirms kidney stone.  No evidence of infected stone. No fevers. UA re-assuring some 5-10 wbc but most likely sloughing from kidney stones nitrite and LE negative.    Reviewed labs--No severe AKI.   D/w pt rx  with ibuprofen/tylenol and oxycodone for breath though pain.  Understands not to drive or work while on oxycodone.  Given zofran/flomax and urology number for follow up.  Pt expressed understanding and felt comfortable with dc home.  I discussed the provisional nature of ED diagnosis, the treatment so far, the ongoing plan of care, follow up appointments and return precautions with the patient and any family or support people present. They expressed understanding and agreed with the plan, discharged home.           ____________________________________________   FINAL CLINICAL IMPRESSION(S) / ED DIAGNOSES   Final diagnoses:  Kidney stone      MEDICATIONS GIVEN DURING THIS VISIT:  Medications  oxyCODONE (Oxy IR/ROXICODONE) immediate release tablet 5 mg (has no administration in time range)  sodium chloride 0.9 % bolus 1,000 mL (1,000 mLs Intravenous New Bag/Given 09/24/21 0346)  HYDROmorphone (DILAUDID) injection 0.5 mg (0.5 mg Intravenous Given 09/24/21 0347)  ondansetron (ZOFRAN) injection 4 mg (4 mg Intravenous Given 09/24/21 0348)  ketorolac (TORADOL) 30 MG/ML injection 15 mg (15 mg Intravenous Given 09/24/21 0353)     ED Discharge Orders     None        Note:  This document was prepared using Dragon voice recognition software and may include unintentional dictation errors.    Concha Se, MD 09/24/21 [id number]

## 2021-09-24 NOTE — ED Triage Notes (Signed)
Patient in with (R) flank pain that started around 1900 last night. Pain is 10/10 and throbbing. Patient reports difficulty starting urinary stream since pain began.

## 2021-09-24 NOTE — Discharge Instructions (Addendum)
You have a kidney stone. See report below.   Take ibuprofen 600mg  every 8 hours daily (as long as you are not on any other blood thinners or have kidney disease) Take tylenol 1g every 8 hours daily. Take oxycodone for breakthrough pain. Do not drive, work, or operate machinery while on this.  Take zofran to help with nausea. Take Flomax to help dilate uretha. Call urology number above to schedule outpatient appointment. Return to ED for fevers, unable to keep food down, or any other concerns.     IMPRESSION: 1. 7 mm obstructing renal stone within the distal right ureter. 2. Nonobstructing 8 mm renal stone within the mid right kidney. 3. Hepatic steatosis.  Take oxycodone as prescribed. Do not drink alcohol, drive or participate in any other potentially dangerous activities while taking this medication as it may make you sleepy. Do not take this medication with any other sedating medications, either prescription or over-the-counter. If you were prescribed Percocet or Vicodin, do not take these with acetaminophen (Tylenol) as it is already contained within these medications.  This medication is an opiate (or narcotic) pain medication and can be habit forming. Use it as little as possible to achieve adequate pain control. Do not use or use it with extreme caution if you have a history of opiate abuse or dependence. If you are on a pain contract with your primary care doctor or a pain specialist, be sure to let them know you were prescribed this medication today from the Emergency Department. This medication is intended for your use only - do not give any to anyone else and keep it in a secure place where nobody else, especially children, have access to it.

## 2021-09-25 ENCOUNTER — Other Ambulatory Visit: Payer: Self-pay | Admitting: *Deleted

## 2021-09-25 DIAGNOSIS — N200 Calculus of kidney: Secondary | ICD-10-CM

## 2021-09-25 LAB — URINE CULTURE

## 2021-10-18 ENCOUNTER — Ambulatory Visit: Payer: Self-pay | Admitting: *Deleted

## 2021-10-18 ENCOUNTER — Other Ambulatory Visit: Payer: Self-pay

## 2021-10-18 ENCOUNTER — Encounter: Payer: Self-pay | Admitting: Family Medicine

## 2021-10-18 ENCOUNTER — Ambulatory Visit (INDEPENDENT_AMBULATORY_CARE_PROVIDER_SITE_OTHER): Payer: 59 | Admitting: Family Medicine

## 2021-10-18 VITALS — BP 140/100 | HR 80 | Ht 69.0 in | Wt 358.0 lb

## 2021-10-18 DIAGNOSIS — R7989 Other specified abnormal findings of blood chemistry: Secondary | ICD-10-CM | POA: Diagnosis not present

## 2021-10-18 DIAGNOSIS — I10 Essential (primary) hypertension: Secondary | ICD-10-CM | POA: Diagnosis not present

## 2021-10-18 DIAGNOSIS — R634 Abnormal weight loss: Secondary | ICD-10-CM

## 2021-10-18 MED ORDER — HYDROCHLOROTHIAZIDE 12.5 MG PO TABS: ORAL_TABLET | ORAL | 1 refills | Status: DC

## 2021-10-18 NOTE — Progress Notes (Addendum)
Date:  10/18/2021   Name:  Kenneth Craig   DOB:  12-29-1990   MRN:  [id number]   Chief Complaint: Hypertension  Hypertension This is a chronic problem. The current episode started more than 1 year ago. The problem has been waxing and waning since onset. The problem is uncontrolled. Associated symptoms include peripheral edema. Pertinent negatives include no anxiety, blurred vision, chest pain, headaches, malaise/fatigue, neck pain, orthopnea, palpitations, PND, shortness of breath or sweats. Risk factors for coronary artery disease include obesity. Past treatments include nothing.   Lab Results  Component Value Date   CREATININE 1.25 (H) 09/24/2021   BUN 17 09/24/2021   NA 137 09/24/2021   K 4.2 09/24/2021   CL 103 09/24/2021   CO2 23 09/24/2021   No results found for: CHOL, HDL, LDLCALC, LDLDIRECT, TRIG, CHOLHDL No results found for: TSH No results found for: HGBA1C Lab Results  Component Value Date   WBC 13.6 (H) 09/24/2021   HGB 16.0 09/24/2021   HCT 46.6 09/24/2021   MCV 82.6 09/24/2021   PLT 387 09/24/2021   Lab Results  Component Value Date   ALT 71 (H) 07/27/2019   AST 25 07/27/2019   ALKPHOS 42 07/27/2019   BILITOT 0.6 07/27/2019     Review of Systems  Constitutional:  Negative for chills, fever and malaise/fatigue.  HENT:  Negative for drooling, ear discharge, ear pain and sore throat.   Eyes:  Negative for blurred vision.  Respiratory:  Negative for cough, shortness of breath and wheezing.   Cardiovascular:  Negative for chest pain, palpitations, orthopnea, leg swelling and PND.  Gastrointestinal:  Negative for abdominal pain, blood in stool, constipation, diarrhea and nausea.  Endocrine: Negative for polydipsia.  Genitourinary:  Negative for dysuria, frequency, hematuria and urgency.  Musculoskeletal:  Negative for back pain, myalgias and neck pain.  Skin:  Negative for rash.  Allergic/Immunologic: Negative for environmental allergies.  Neurological:   Negative for dizziness and headaches.  Hematological:  Does not bruise/bleed easily.  Psychiatric/Behavioral:  Negative for suicidal ideas. The patient is not nervous/anxious.    Patient Active Problem List   Diagnosis Date Noted   Meningitis 07/22/2019    No Known Allergies  Past Surgical History:  Procedure Laterality Date   TONSILLECTOMY      Social History   Tobacco Use   Smoking status: Never   Smokeless tobacco: Never  Vaping Use   Vaping Use: Never used  Substance Use Topics   Alcohol use: Yes    Comment: ocassionally   Drug use: Yes    Types: Marijuana     Medication list has been reviewed and updated.  Current Meds  Medication Sig   Biotin 1000 MCG CHEW Chew 5,000 mg by mouth daily.    PHQ 2/9 Scores 10/18/2021 08/04/2020  PHQ - 2 Score 0 2  PHQ- 9 Score 0 6    GAD 7 : Generalized Anxiety Score 10/18/2021 08/04/2020  Nervous, Anxious, on Edge 0 1  Control/stop worrying 0 1  Worry too much - different things 0 1  Trouble relaxing 0 0  Restless 0 0  Easily annoyed or irritable 0 1  Afraid - awful might happen 0 1  Total GAD 7 Score 0 5  Anxiety Difficulty Not difficult at all Not difficult at all    BP Readings from Last 3 Encounters:  10/18/21 (!) 140/100  09/24/21 (!) 169/102  09/06/20 (!) 151/107    Physical Exam Vitals and nursing note reviewed.  Constitutional:      Appearance: He is obese.  HENT:     Head: Normocephalic.     Right Ear: Tympanic membrane, ear canal and external ear normal.     Left Ear: Tympanic membrane, ear canal and external ear normal.     Nose: Nose normal.  Eyes:     General: No scleral icterus.       Right eye: No discharge.        Left eye: No discharge.     Conjunctiva/sclera: Conjunctivae normal.     Pupils: Pupils are equal, round, and reactive to light.  Neck:     Thyroid: No thyromegaly.     Vascular: No JVD.     Trachea: No tracheal deviation.  Cardiovascular:     Rate and Rhythm: Normal rate and  regular rhythm.     Heart sounds: Normal heart sounds, S1 normal and S2 normal. No murmur heard. No systolic murmur is present.  No diastolic murmur is present.    No friction rub. No gallop. No S3 or S4 sounds.  Pulmonary:     Effort: No respiratory distress.     Breath sounds: Normal breath sounds. No wheezing or rales.  Abdominal:     General: Bowel sounds are normal.     Palpations: Abdomen is soft. There is no mass.     Tenderness: There is no abdominal tenderness. There is no guarding or rebound.  Musculoskeletal:        General: No tenderness. Normal range of motion.     Cervical back: Normal range of motion and neck supple.  Lymphadenopathy:     Cervical: No cervical adenopathy.  Skin:    General: Skin is warm.     Findings: No rash.  Neurological:     Mental Status: He is alert and oriented to person, place, and time.     Cranial Nerves: No cranial nerve deficit.     Deep Tendon Reflexes: Reflexes are normal and symmetric.    Wt Readings from Last 3 Encounters:  10/18/21 (!) 358 lb (162.4 kg)  09/24/21 (!) 370 lb (167.8 kg)  09/06/20 (!) 360 lb (163.3 kg)    BP (!) 140/100   Pulse 80   Ht 5\' 9"  (1.753 m)   Wt (!) 358 lb (162.4 kg)   BMI 52.87 kg/m   Assessment and Plan:   1. Essential hypertension Chronic.  Controlled.  Stable.  Blood pressure is 140/100.  Patient has not been taking his blood pressure medicine.  We will resume hydrochlorothiazide 12.5 mg once a day.  We will recheck patient in 6 weeks.  We will check renal function panel for baseline electrolytes prior. - hydrochlorothiazide (HYDRODIURIL) 12.5 MG tablet; TAKE 1 TABLET(12.5 MG) BY MOUTH DAILY  Dispense: 90 tablet; Refill: 1 - Lipid Panel With LDL/HDL Ratio - Renal Function Panel  2. Abnormal CBC Patient's had abnormal CBCs with increased leukocytes and some immaturity of the leukocytes and we will check a CBC given that he has had some recent weight loss. - CBC with  Differential/Platelet  3. Weight loss Patient's had a 12 pound weight loss but just minimizing his food intake but we will recheck if continued weight loss is noted.  In the meantime we are resuming his hydrochlorothiazide for blood pressure control. - Lipid Panel With LDL/HDL Ratio - Renal Function Panel

## 2021-10-18 NOTE — Telephone Encounter (Signed)
Summary: advice   Pt had a medication interaction question, is there an issue with marijuana use and hydrochlorothiazide (HYDRODIURIL) 12.5 MG tablet, pt wanted to know if there would be a negative interaction.      Patient states he smokes marijuana occasionally and wants to know if HTCZ has SE with that. Patient advised to not smoke- but did advise him- any medications mixed can have unwanted SE. Online source list: dizziness, sedation, delay of action, severe vomiting, low BP. Patient advised it is hard to know what SE he would expect- but certainly he should report any changes and monitor BP for provider to make sure medications is working properly. Will send message to provider for review- but she will also advise to stop smoking. Reason for Disposition  [1] Caller has NON-URGENT medicine question about med that PCP prescribed AND [2] triager unable to answer question  Answer Assessment - Initial Assessment Questions 1. NAME of MEDICATION: "What medicine are you calling about?"     Hydrochlorothiazide  2. QUESTION: "What is your question?" (e.g., double dose of medicine, side effect)     Is there any know interaction between HCTZ and marijuana? 3. PRESCRIBING HCP: "Who prescribed it?" Reason: if prescribed by specialist, call should be referred to that group.     PCP  Protocols used: Medication Question Call-A-AH

## 2021-10-19 LAB — RENAL FUNCTION PANEL
Albumin: 4.9 g/dL (ref 4.1–5.2)
BUN/Creatinine Ratio: 10 (ref 9–20)
BUN: 10 mg/dL (ref 6–20)
CO2: 26 mmol/L (ref 20–29)
Calcium: 10 mg/dL (ref 8.7–10.2)
Chloride: 100 mmol/L (ref 96–106)
Creatinine, Ser: 0.98 mg/dL (ref 0.76–1.27)
Glucose: 93 mg/dL (ref 70–99)
Phosphorus: 3.8 mg/dL (ref 2.8–4.1)
Potassium: 4.8 mmol/L (ref 3.5–5.2)
Sodium: 141 mmol/L (ref 134–144)
eGFR: 108 mL/min/{1.73_m2} (ref >59)

## 2021-10-19 LAB — LIPID PANEL WITH LDL/HDL RATIO
Cholesterol, Total: 240 mg/dL — ABNORMAL HIGH (ref 100–199)
HDL: 35 mg/dL — ABNORMAL LOW (ref >39)
LDL Chol Calc (NIH): 143 mg/dL — ABNORMAL HIGH (ref 0–99)
LDL/HDL Ratio: 4.1 ratio — ABNORMAL HIGH (ref 0.0–3.6)
Triglycerides: 340 mg/dL — ABNORMAL HIGH (ref 0–149)
VLDL Cholesterol Cal: 62 mg/dL — ABNORMAL HIGH (ref 5–40)

## 2021-10-19 LAB — CBC WITH DIFFERENTIAL/PLATELET
Basophils Absolute: 0.1 10*3/uL (ref 0.0–0.2)
Basos: 1 %
EOS (ABSOLUTE): 0.2 10*3/uL (ref 0.0–0.4)
Eos: 2 %
Hematocrit: 49.3 % (ref 37.5–51.0)
Hemoglobin: 16.3 g/dL (ref 13.0–17.7)
Immature Grans (Abs): 0 10*3/uL (ref 0.0–0.1)
Immature Granulocytes: 0 %
Lymphocytes Absolute: 3.9 10*3/uL — ABNORMAL HIGH (ref 0.7–3.1)
Lymphs: 42 %
MCH: 27.3 pg (ref 26.6–33.0)
MCHC: 33.1 g/dL (ref 31.5–35.7)
MCV: 83 fL (ref 79–97)
Monocytes Absolute: 0.9 10*3/uL (ref 0.1–0.9)
Monocytes: 9 %
Neutrophils Absolute: 4.2 10*3/uL (ref 1.4–7.0)
Neutrophils: 46 %
Platelets: 382 10*3/uL (ref 150–450)
RBC: 5.96 x10E6/uL — ABNORMAL HIGH (ref 4.14–5.80)
RDW: 12.5 % (ref 11.6–15.4)
WBC: 9.2 10*3/uL (ref 3.4–10.8)

## 2021-11-05 ENCOUNTER — Ambulatory Visit
Admission: EM | Admit: 2021-11-05 | Discharge: 2021-11-05 | Disposition: A | Discharge status: Home / Self Care | Payer: 59 | Attending: Internal Medicine | Admitting: Internal Medicine

## 2021-11-05 ENCOUNTER — Other Ambulatory Visit: Payer: Self-pay

## 2021-11-05 DIAGNOSIS — H6122 Impacted cerumen, left ear: Secondary | ICD-10-CM

## 2021-11-05 NOTE — ED Notes (Signed)
Patient complains of left ear fullness x 2 days. Denies any pain.

## 2021-11-05 NOTE — Discharge Instructions (Signed)
Avoid using Q tips deep in the ear canal to avoid pushing it deep

## 2021-11-05 NOTE — ED Provider Notes (Signed)
[facility]    CSN: [id number] Arrival date & time: 11/05/21  0848      History   Chief Complaint Chief Complaint  Patient presents with   Ear Fullness         HPI Kenneth Craig is a 30 y.o. male who presents with L ear fullness since yesterday. Has not had URI. He admits uses Qtip to clean his ears. Denies fever.     History reviewed. No pertinent past medical history.  Patient Active Problem List   Diagnosis Date Noted   Meningitis 07/22/2019    Past Surgical History:  Procedure Laterality Date   TONSILLECTOMY         Home Medications    Prior to Admission medications   Medication Sig Start Date End Date Taking? Authorizing Provider  Biotin 1000 MCG CHEW Chew 5,000 mg by mouth daily.   Yes [provider]  hydrochlorothiazide (HYDRODIURIL) 12.5 MG tablet TAKE 1 TABLET(12.5 MG) BY MOUTH DAILY 10/18/21  Yes Juline Patch, MD    Family History Family History  Problem Relation Age of Onset   Heart disease Father     Social History Social History   Tobacco Use   Smoking status: Never   Smokeless tobacco: Never  Vaping Use   Vaping Use: Never used  Substance Use Topics   Alcohol use: Yes    Comment: ocassionally   Drug use: Yes    Types: Marijuana     Allergies   Patient has no known allergies.   Review of Systems Review of Systems  Constitutional:  Negative for fever.  HENT:  Positive for hearing loss. Negative for congestion, ear discharge and ear pain.        On L side due to ear feeling plugged   Respiratory:  Negative for cough.   Neurological:  Negative for dizziness.    Physical Exam Triage Vital Signs ED Triage Vitals  Enc Vitals Group     BP 11/05/21 1006 135/87     Pulse Rate 11/05/21 1006 75     Resp 11/05/21 1006 17     Temp 11/05/21 1006 98.2 F (36.8 C)     Temp Source 11/05/21 1006 Oral     SpO2 11/05/21 1006 98 %     Weight 11/05/21 1004 (!) 358 lb (162.4 kg)     Height 11/05/21 1004 5'  9" (1.753 m)     Head Circumference --      Peak Flow --      Pain Score 11/05/21 1005 0     Pain Loc --      Pain Edu? --      Excl. in [place]? --    No data found.  Updated Vital Signs BP 135/87 (BP Location: Left Arm)   Pulse 75   Temp 98.2 F (36.8 C) (Oral)   Resp 17   Ht 5\' 9"  (1.753 m)   Wt (!) 358 lb (162.4 kg)   SpO2 98%   BMI 52.87 kg/m   Visual Acuity Right Eye Distance:   Left Eye Distance:   Bilateral Distance:    Right Eye Near:   Left Eye Near:    Bilateral Near:     Physical Exam Vitals and nursing note reviewed.  Constitutional:      General: He is not in acute distress.    Appearance: He is obese. He is not toxic-appearing.  HENT:     Right Ear: There is impacted cerumen.  Left Ear: Tympanic membrane, ear canal and external ear normal.     Ears:     Comments: After lavage L TM is normal.  Eyes:     General: No scleral icterus.    Conjunctiva/sclera: Conjunctivae normal.  Pulmonary:     Effort: Pulmonary effort is normal.  Musculoskeletal:        General: Normal range of motion.     Cervical back: Neck supple.  Lymphadenopathy:     Cervical: No cervical adenopathy.  Skin:    General: Skin is warm and dry.  Neurological:     Mental Status: He is alert and oriented to person, place, and time.     Gait: Gait normal.  Psychiatric:        Mood and Affect: Mood normal.        Behavior: Behavior normal.        Thought Content: Thought content normal.        Judgment: Judgment normal.     UC Treatments / Results  Labs (all labs ordered are listed, but only abnormal results are displayed) Labs Reviewed - No data to display  EKG   Radiology No results found.  Procedures Procedures (including critical care time)  Medications Ordered in UC Medications - No data to display  Initial Impression / Assessment and Plan / UC Course  I have reviewed the triage vital signs and the nursing notes. L cerumen impaction, resolved after  lavage FU prn. See instructions.      Final Clinical Impressions(s) / UC Diagnoses   Final diagnoses:  None   Discharge Instructions   None    ED Prescriptions   None    PDMP not reviewed this encounter.   Garey Ham, PA-C 11/05/21 1113

## 2021-11-15 ENCOUNTER — Other Ambulatory Visit: Payer: Self-pay

## 2021-11-15 ENCOUNTER — Encounter: Payer: Self-pay | Admitting: Family Medicine

## 2021-11-15 ENCOUNTER — Ambulatory Visit (INDEPENDENT_AMBULATORY_CARE_PROVIDER_SITE_OTHER): Payer: 59 | Admitting: Family Medicine

## 2021-11-15 VITALS — BP 134/102 | HR 76 | Ht 69.0 in | Wt 358.0 lb

## 2021-11-15 DIAGNOSIS — I10 Essential (primary) hypertension: Secondary | ICD-10-CM | POA: Diagnosis not present

## 2021-11-15 MED ORDER — LISINOPRIL-HYDROCHLOROTHIAZIDE 10-12.5 MG PO TABS: 1.0000 | ORAL_TABLET | Freq: Every day | ORAL | 3 refills | Status: DC

## 2021-11-15 NOTE — Progress Notes (Signed)
Date:  11/15/2021   Name:  Kenneth Craig   DOB:  May 17, 1991   MRN:  [id number]   Chief Complaint: Follow-up  Hypertension This is a new problem. The current episode started more than 1 year ago. The problem has been waxing and waning since onset. The problem is uncontrolled. Pertinent negatives include no anxiety, blurred vision, chest pain, headaches, malaise/fatigue, neck pain, orthopnea, palpitations, peripheral edema, PND, shortness of breath or sweats. There are no associated agents to hypertension. Past treatments include diuretics. The current treatment provides mild improvement. There are no compliance problems.  There is no history of angina, kidney disease, CAD/MI, CVA, heart failure, left ventricular hypertrophy, PVD or retinopathy. There is no history of chronic renal disease, a hypertension causing med or renovascular disease.   Lab Results  Component Value Date   CREATININE 0.98 10/18/2021   BUN 10 10/18/2021   NA 141 10/18/2021   K 4.8 10/18/2021   CL 100 10/18/2021   CO2 26 10/18/2021   Lab Results  Component Value Date   CHOL 240 (H) 10/18/2021   HDL 35 (L) 10/18/2021   LDLCALC 143 (H) 10/18/2021   TRIG 340 (H) 10/18/2021   No results found for: TSH No results found for: HGBA1C Lab Results  Component Value Date   WBC 9.2 10/18/2021   HGB 16.3 10/18/2021   HCT 49.3 10/18/2021   MCV 83 10/18/2021   PLT 382 10/18/2021   Lab Results  Component Value Date   ALT 71 (H) 07/27/2019   AST 25 07/27/2019   ALKPHOS 42 07/27/2019   BILITOT 0.6 07/27/2019   No components found for: VITD  Review of Systems  Constitutional:  Negative for chills, fever and malaise/fatigue.  HENT:  Negative for drooling, ear discharge, ear pain and sore throat.   Eyes:  Negative for blurred vision.  Respiratory:  Negative for cough, shortness of breath and wheezing.   Cardiovascular:  Negative for chest pain, palpitations, orthopnea, leg swelling and PND.  Gastrointestinal:   Negative for abdominal pain, blood in stool, constipation, diarrhea and nausea.  Endocrine: Negative for polydipsia.  Genitourinary:  Negative for dysuria, frequency, hematuria and urgency.  Musculoskeletal:  Negative for back pain, myalgias and neck pain.  Skin:  Negative for rash.  Allergic/Immunologic: Negative for environmental allergies.  Neurological:  Negative for dizziness and headaches.  Hematological:  Does not bruise/bleed easily.  Psychiatric/Behavioral:  Negative for suicidal ideas. The patient is not nervous/anxious.    Patient Active Problem List   Diagnosis Date Noted   Meningitis 07/22/2019    No Known Allergies  Past Surgical History:  Procedure Laterality Date   TONSILLECTOMY      Social History   Tobacco Use   Smoking status: Never   Smokeless tobacco: Never  Vaping Use   Vaping Use: Never used  Substance Use Topics   Alcohol use: Yes   Drug use: Yes    Types: Marijuana     Medication list has been reviewed and updated.  Current Meds  Medication Sig   Biotin 1000 MCG CHEW Chew 5,000 mg by mouth daily.   hydrochlorothiazide (HYDRODIURIL) 12.5 MG tablet TAKE 1 TABLET(12.5 MG) BY MOUTH DAILY    PHQ 2/9 Scores 11/15/2021 10/18/2021 08/04/2020  PHQ - 2 Score 0 0 2  PHQ- 9 Score 1 0 6    GAD 7 : Generalized Anxiety Score 11/15/2021 10/18/2021 08/04/2020  Nervous, Anxious, on Edge 0 0 1  Control/stop worrying 0 0 1  Worry too  much - different things 0 0 1  Trouble relaxing 0 0 0  Restless 0 0 0  Easily annoyed or irritable 1 0 1  Afraid - awful might happen 0 0 1  Total GAD 7 Score 1 0 5  Anxiety Difficulty Not difficult at all Not difficult at all Not difficult at all    BP Readings from Last 3 Encounters:  11/15/21 (!) 134/102  11/05/21 135/87  10/18/21 (!) 140/100    Physical Exam Vitals and nursing note reviewed.  HENT:     Head: Normocephalic.     Right Ear: Tympanic membrane, ear canal and external ear normal.     Left Ear:  Tympanic membrane, ear canal and external ear normal.     Nose: Nose normal. No rhinorrhea.  Eyes:     General: No scleral icterus.       Right eye: No discharge.        Left eye: No discharge.     Conjunctiva/sclera: Conjunctivae normal.     Pupils: Pupils are equal, round, and reactive to light.  Neck:     Thyroid: No thyromegaly.     Vascular: No JVD.     Trachea: No tracheal deviation.  Cardiovascular:     Rate and Rhythm: Normal rate and regular rhythm.     Heart sounds: Normal heart sounds. No murmur heard.   No friction rub. No gallop.  Pulmonary:     Effort: No respiratory distress.     Breath sounds: Normal breath sounds. No wheezing, rhonchi or rales.  Abdominal:     General: Bowel sounds are normal.     Palpations: Abdomen is soft. There is no mass.     Tenderness: There is no abdominal tenderness. There is no guarding or rebound.  Musculoskeletal:        General: No tenderness. Normal range of motion.     Cervical back: Normal range of motion and neck supple.  Lymphadenopathy:     Cervical: No cervical adenopathy.  Skin:    General: Skin is warm.     Findings: No rash.  Neurological:     Mental Status: He is alert and oriented to person, place, and time.     Cranial Nerves: No cranial nerve deficit.     Deep Tendon Reflexes: Reflexes are normal and symmetric.    Wt Readings from Last 3 Encounters:  11/15/21 (!) 358 lb (162.4 kg)  11/05/21 (!) 358 lb (162.4 kg)  10/18/21 (!) 358 lb (162.4 kg)    BP (!) 134/102 (BP Location: Left Arm, Patient Position: Sitting, Cuff Size: Large)   Pulse 76   Ht 5\' 9"  (1.753 m)   Wt (!) 358 lb (162.4 kg)   SpO2 95%   BMI 52.87 kg/m   Assessment and Plan:  1. Essential hypertension Relatively new in awareness.  Stable.  Uncontrolled.  Blood pressure today is 134/102.  We will discontinue the single dosing of hydrochlorothiazide to 12.5 mg once a day and exchange it for combination of lisinopril 10 mg and  hydrochlorothiazide 12.5 mg to be taken once a day and will recheck in 6 weeks. - lisinopril-hydrochlorothiazide (ZESTORETIC) 10-12.5 MG tablet; Take 1 tablet by mouth daily.  Dispense: 90 tablet; Refill: 3

## 2021-12-27 ENCOUNTER — Encounter: Payer: Self-pay | Admitting: Family Medicine

## 2021-12-27 ENCOUNTER — Ambulatory Visit (INDEPENDENT_AMBULATORY_CARE_PROVIDER_SITE_OTHER): Payer: 59 | Admitting: Family Medicine

## 2021-12-27 ENCOUNTER — Other Ambulatory Visit: Payer: Self-pay

## 2021-12-27 VITALS — BP 120/80 | HR 72 | Ht 69.0 in | Wt 352.0 lb

## 2021-12-27 DIAGNOSIS — I10 Essential (primary) hypertension: Secondary | ICD-10-CM

## 2021-12-27 DIAGNOSIS — E785 Hyperlipidemia, unspecified: Secondary | ICD-10-CM | POA: Diagnosis not present

## 2021-12-27 MED ORDER — LISINOPRIL-HYDROCHLOROTHIAZIDE 10-12.5 MG PO TABS: 1.0000 | ORAL_TABLET | Freq: Every day | ORAL | 1 refills | Status: DC

## 2021-12-27 NOTE — Progress Notes (Signed)
Date:  12/27/2021   Name:  Kenneth Craig   DOB:  1991/07/07   MRN:  [id number]   Chief Complaint: Hypertension and Hyperlipidemia (Elevated at last visit- tried diet and exercise)  Hypertension This is a chronic problem. The current episode started more than 1 year ago. The problem has been gradually improving since onset. The problem is controlled. Pertinent negatives include no anxiety, blurred vision, chest pain, headaches, malaise/fatigue, neck pain, orthopnea, palpitations, peripheral edema, PND, shortness of breath or sweats. There are no associated agents to hypertension. Risk factors for coronary artery disease include dyslipidemia. The current treatment provides moderate improvement. There are no compliance problems.  There is no history of angina, kidney disease, CAD/MI, CVA, heart failure, left ventricular hypertrophy, PVD or retinopathy. There is no history of chronic renal disease, a hypertension causing med or renovascular disease.  Hyperlipidemia He has no history of chronic renal disease. Pertinent negatives include no chest pain or shortness of breath.   Lab Results  Component Value Date   NA 141 10/18/2021   K 4.8 10/18/2021   CO2 26 10/18/2021   GLUCOSE 93 10/18/2021   BUN 10 10/18/2021   CREATININE 0.98 10/18/2021   CALCIUM 10.0 10/18/2021   EGFR 108 10/18/2021   GFRNONAA >60 09/24/2021   Lab Results  Component Value Date   CHOL 240 (H) 10/18/2021   HDL 35 (L) 10/18/2021   LDLCALC 143 (H) 10/18/2021   TRIG 340 (H) 10/18/2021   No results found for: TSH No results found for: HGBA1C Lab Results  Component Value Date   WBC 9.2 10/18/2021   HGB 16.3 10/18/2021   HCT 49.3 10/18/2021   MCV 83 10/18/2021   PLT 382 10/18/2021   Lab Results  Component Value Date   ALT 71 (H) 07/27/2019   AST 25 07/27/2019   ALKPHOS 42 07/27/2019   BILITOT 0.6 07/27/2019   No results found for: 25OHVITD2, 25OHVITD3, VD25OH   Review of Systems  Constitutional:  Negative  for malaise/fatigue.  Eyes:  Negative for blurred vision.  Respiratory:  Negative for shortness of breath.   Cardiovascular:  Negative for chest pain, palpitations, orthopnea and PND.  Musculoskeletal:  Negative for neck pain.  Neurological:  Negative for headaches.   Patient Active Problem List   Diagnosis Date Noted   Meningitis 07/22/2019    No Known Allergies  Past Surgical History:  Procedure Laterality Date   TONSILLECTOMY      Social History   Tobacco Use   Smoking status: Never   Smokeless tobacco: Never  Vaping Use   Vaping Use: Never used  Substance Use Topics   Alcohol use: Yes   Drug use: Yes    Types: Marijuana     Medication list has been reviewed and updated.  Current Meds  Medication Sig   lisinopril-hydrochlorothiazide (ZESTORETIC) 10-12.5 MG tablet Take 1 tablet by mouth daily.   [DISCONTINUED] Biotin 1000 MCG CHEW Chew 5,000 mg by mouth daily.    PHQ 2/9 Scores 11/15/2021 10/18/2021 08/04/2020  PHQ - 2 Score 0 0 2  PHQ- 9 Score 1 0 6    GAD 7 : Generalized Anxiety Score 11/15/2021 10/18/2021 08/04/2020  Nervous, Anxious, on Edge 0 0 1  Control/stop worrying 0 0 1  Worry too much - different things 0 0 1  Trouble relaxing 0 0 0  Restless 0 0 0  Easily annoyed or irritable 1 0 1  Afraid - awful might happen 0 0 1  Total GAD  7 Score 1 0 5  Anxiety Difficulty Not difficult at all Not difficult at all Not difficult at all    BP Readings from Last 3 Encounters:  12/27/21 120/80  11/15/21 (!) 134/102  11/05/21 135/87    Physical Exam  Wt Readings from Last 3 Encounters:  12/27/21 (!) 352 lb (159.7 kg)  11/15/21 (!) 358 lb (162.4 kg)  11/05/21 (!) 358 lb (162.4 kg)    BP 120/80    Pulse 72    Ht _0  (1.753 m)    Wt (!) 352 lb (159.7 kg)    BMI 51.98 kg/m   Assessment and Plan:  1. Essential hypertension Chronic.  Controlled.  Stable.  Patient is tolerating medication well and blood pressure has come down to 120/80.  We will  continue lisinopril hydrochlorothiazide 10-12.5 mg once a day.  And will recheck renal function panel in 6 months upon return. - lisinopril-hydrochlorothiazide (ZESTORETIC) 10-12.5 MG tablet; Take 1 tablet by mouth daily.  Dispense: 90 tablet; Refill: 1  2. Dyslipidemia Chronic.  Relatively stable.  As noted that triglycerides and LDL were elevated with lipid panel but fasting was a concern at the measurement.  Patient is fasting today and will recheck lipid panel to see if dietary approach is going to be sufficient for control. - Lipid Panel With LDL/HDL Ratio

## 2021-12-28 ENCOUNTER — Other Ambulatory Visit: Payer: Self-pay

## 2021-12-28 DIAGNOSIS — E782 Mixed hyperlipidemia: Secondary | ICD-10-CM

## 2021-12-28 LAB — LIPID PANEL WITH LDL/HDL RATIO
Cholesterol, Total: 258 mg/dL — ABNORMAL HIGH (ref 100–199)
HDL: 39 mg/dL — ABNORMAL LOW (ref >39)
LDL Chol Calc (NIH): 185 mg/dL — ABNORMAL HIGH (ref 0–99)
LDL/HDL Ratio: 4.7 ratio — ABNORMAL HIGH (ref 0.0–3.6)
Triglycerides: 179 mg/dL — ABNORMAL HIGH (ref 0–149)
VLDL Cholesterol Cal: 34 mg/dL (ref 5–40)

## 2021-12-28 MED ORDER — ATORVASTATIN CALCIUM 10 MG PO TABS
10.0000 mg | ORAL_TABLET | Freq: Every day | ORAL | 1 refills | Status: DC
Start: 2021-12-28 — End: 2022-01-29

## 2021-12-28 NOTE — Progress Notes (Signed)
Sent in [employer]

## 2022-01-11 DIAGNOSIS — R69 Illness, unspecified: Secondary | ICD-10-CM | POA: Diagnosis not present

## 2022-01-11 DIAGNOSIS — Z113 Encounter for screening for infections with a predominantly sexual mode of transmission: Secondary | ICD-10-CM | POA: Diagnosis not present

## 2022-01-28 ENCOUNTER — Other Ambulatory Visit: Payer: Self-pay | Admitting: Family Medicine

## 2022-01-28 DIAGNOSIS — E782 Mixed hyperlipidemia: Secondary | ICD-10-CM

## 2022-02-16 ENCOUNTER — Other Ambulatory Visit: Payer: Self-pay

## 2022-02-16 DIAGNOSIS — E782 Mixed hyperlipidemia: Secondary | ICD-10-CM | POA: Diagnosis not present

## 2022-02-16 DIAGNOSIS — R69 Illness, unspecified: Secondary | ICD-10-CM | POA: Diagnosis not present

## 2022-02-17 LAB — HEPATIC FUNCTION PANEL
ALT: 38 IU/L (ref 0–44)
AST: 25 IU/L (ref 0–40)
Albumin: 4.6 g/dL (ref 4.1–5.2)
Alkaline Phosphatase: 59 IU/L (ref 44–121)
Bilirubin Total: 0.3 mg/dL (ref 0.0–1.2)
Bilirubin, Direct: 0.1 mg/dL (ref 0.00–0.40)
Total Protein: 7.4 g/dL (ref 6.0–8.5)

## 2022-02-17 LAB — LIPID PANEL WITH LDL/HDL RATIO
Cholesterol, Total: 164 mg/dL (ref 100–199)
HDL: 31 mg/dL — ABNORMAL LOW (ref >39)
LDL Chol Calc (NIH): 96 mg/dL (ref 0–99)
LDL/HDL Ratio: 3.1 ratio (ref 0.0–3.6)
Triglycerides: 218 mg/dL — ABNORMAL HIGH (ref 0–149)
VLDL Cholesterol Cal: 37 mg/dL (ref 5–40)

## 2022-03-23 ENCOUNTER — Other Ambulatory Visit: Payer: Self-pay | Admitting: Family Medicine

## 2022-03-23 DIAGNOSIS — E782 Mixed hyperlipidemia: Secondary | ICD-10-CM

## 2022-03-23 NOTE — Telephone Encounter (Signed)
Requested Prescriptions  ?Pending Prescriptions Disp Refills  ?? atorvastatin (LIPITOR) 10 MG tablet [Pharmacy Med Name: ATORVASTATIN 10 MG TABLET] 90 tablet 1  ?  Sig: TAKE 1 TABLET BY MOUTH EVERY DAY  ?  ? Cardiovascular:  Antilipid - Statins Failed - 03/23/2022  1:54 AM  ?  ?  Failed - Lipid Panel in normal range within the last 12 months  ?  Cholesterol, Total  ?Date Value Ref Range Status  ?02/16/2022 164 100 - 199 mg/dL Final  ? ?LDL Chol Calc (NIH)  ?Date Value Ref Range Status  ?02/16/2022 96 0 - 99 mg/dL Final  ? ?HDL  ?Date Value Ref Range Status  ?02/16/2022 31 (L) >39 mg/dL Final  ? ?Triglycerides  ?Date Value Ref Range Status  ?02/16/2022 218 (H) 0 - 149 mg/dL Final  ? ?  ?  ?  Passed - Patient is not pregnant  ?  ?  Passed - Valid encounter within last 12 months  ?  Recent Outpatient Visits   ?      ? 2 months ago Essential hypertension  ? [facility] Duanne Limerick, MD  ? 4 months ago Essential hypertension  ? [facility] Duanne Limerick, MD  ? 5 months ago Essential hypertension  ? [facility] Duanne Limerick, MD  ? 1 year ago Establishing care with new doctor, encounter for  ? [facility] Duanne Limerick, MD  ?  ?  ?Future Appointments   ?        ? In 3 months Duanne Limerick, MD [facility]  ?  ? ?  ?  ?  ? ?

## 2022-06-15 ENCOUNTER — Other Ambulatory Visit: Payer: Self-pay | Admitting: Family Medicine

## 2022-06-15 DIAGNOSIS — I10 Essential (primary) hypertension: Secondary | ICD-10-CM

## 2022-06-28 ENCOUNTER — Ambulatory Visit: Payer: 59 | Admitting: Family Medicine

## 2022-06-29 ENCOUNTER — Ambulatory Visit: Payer: 59 | Admitting: Family Medicine

## 2022-07-04 ENCOUNTER — Ambulatory Visit: Payer: 59 | Admitting: Family Medicine

## 2022-07-10 ENCOUNTER — Other Ambulatory Visit: Payer: Self-pay | Admitting: Family Medicine

## 2022-07-10 DIAGNOSIS — I10 Essential (primary) hypertension: Secondary | ICD-10-CM

## 2022-07-11 NOTE — Telephone Encounter (Signed)
Requested medication (s) are due for refill today: yes  Requested medication (s) are on the active medication list: yes  Last refill:  06/15/22 #30  Future visit scheduled: yes  Notes to clinic:  pt already given 30 day courtesy refill - has appt in 2 days. Was not sure how much to refill. Attempted to call pt to see if he has enough med to last until appt- LM on VM.   Requested Prescriptions  Pending Prescriptions Disp Refills   lisinopril-hydrochlorothiazide (ZESTORETIC) 10-12.5 MG tablet [Pharmacy Med Name: LISINOPRIL-HCTZ 10-12.5 MG TAB] 30 tablet 0    Sig: TAKE 1 TABLET BY MOUTH EVERY DAY     Cardiovascular:  ACEI + Diuretic Combos Failed - 07/10/2022  2:30 PM      Failed - Na in normal range and within 180 days    Sodium  Date Value Ref Range Status  10/18/2021 141 134 - 144 mmol/L Final         Failed - K in normal range and within 180 days    Potassium  Date Value Ref Range Status  10/18/2021 4.8 3.5 - 5.2 mmol/L Final         Failed - Cr in normal range and within 180 days    Creatinine, Ser  Date Value Ref Range Status  10/18/2021 0.98 0.76 - 1.27 mg/dL Final         Failed - eGFR is 30 or above and within 180 days    GFR calc Af Amer  Date Value Ref Range Status  07/27/2019 >60 >60 mL/min Final   GFR, Estimated  Date Value Ref Range Status  09/24/2021 >60 >60 mL/min Final    Comment:    (NOTE) Calculated using the CKD-EPI Creatinine Equation (2021)    eGFR  Date Value Ref Range Status  10/18/2021 108 >59 mL/min/1.73 Final         Failed - Valid encounter within last 6 months    Recent Outpatient Visits           6 months ago Essential hypertension   [place], Deanna C, MD   7 months ago Essential hypertension   [place], Deanna C, MD   8 months ago Essential hypertension   [facility] Juline Patch, MD   1 year ago Establishing care with new doctor, encounter for   [place], MD       Future Appointments             In 2 days Juline Patch, MD [facility] [place] - Patient is not pregnant      Passed - Last BP in normal range    BP Readings from Last 1 Encounters:  12/27/21 120/80

## 2022-07-13 ENCOUNTER — Encounter: Payer: Self-pay | Admitting: Family Medicine

## 2022-07-13 ENCOUNTER — Ambulatory Visit (INDEPENDENT_AMBULATORY_CARE_PROVIDER_SITE_OTHER): Payer: 59 | Admitting: Family Medicine

## 2022-07-13 VITALS — BP 120/80 | HR 84 | Ht 69.0 in | Wt 338.0 lb

## 2022-07-13 DIAGNOSIS — F902 Attention-deficit hyperactivity disorder, combined type: Secondary | ICD-10-CM | POA: Diagnosis not present

## 2022-07-13 DIAGNOSIS — R69 Illness, unspecified: Secondary | ICD-10-CM | POA: Diagnosis not present

## 2022-07-13 DIAGNOSIS — I10 Essential (primary) hypertension: Secondary | ICD-10-CM

## 2022-07-13 DIAGNOSIS — E782 Mixed hyperlipidemia: Secondary | ICD-10-CM

## 2022-07-13 MED ORDER — LISINOPRIL-HYDROCHLOROTHIAZIDE 10-12.5 MG PO TABS: 1.0000 | ORAL_TABLET | Freq: Every day | ORAL | 1 refills | Status: DC

## 2022-07-13 MED ORDER — ATORVASTATIN CALCIUM 10 MG PO TABS: 10.0000 mg | ORAL_TABLET | Freq: Every day | ORAL | 1 refills | Status: DC

## 2022-07-13 NOTE — Progress Notes (Signed)
Date:  07/13/2022   Name:  Kenneth Craig   DOB:  Sep 29, 1991   MRN:  [id number]   Chief Complaint: Hypertension and Hyperlipidemia  Hypertension This is a chronic problem. The current episode started more than 1 year ago. The problem has been gradually improving since onset. The problem is controlled. Pertinent negatives include no blurred vision, chest pain, headaches, neck pain, orthopnea, palpitations, peripheral edema, PND or shortness of breath. There are no associated agents to hypertension. Risk factors for coronary artery disease include male gender. Past treatments include ACE inhibitors and diuretics. The current treatment provides moderate improvement. There are no compliance problems.  There is no history of angina, kidney disease, CAD/MI, CVA, heart failure, left ventricular hypertrophy, PVD or retinopathy. There is no history of chronic renal disease, a hypertension causing med or renovascular disease.  Hyperlipidemia He has no history of chronic renal disease. Pertinent negatives include no chest pain, myalgias or shortness of breath.    Lab Results  Component Value Date   NA 141 10/18/2021   K 4.8 10/18/2021   CO2 26 10/18/2021   GLUCOSE 93 10/18/2021   BUN 10 10/18/2021   CREATININE 0.98 10/18/2021   CALCIUM 10.0 10/18/2021   EGFR 108 10/18/2021   GFRNONAA >60 09/24/2021   Lab Results  Component Value Date   CHOL 164 02/16/2022   HDL 31 (L) 02/16/2022   LDLCALC 96 02/16/2022   TRIG 218 (H) 02/16/2022   No results found for: "TSH" No results found for: "HGBA1C" Lab Results  Component Value Date   WBC 9.2 10/18/2021   HGB 16.3 10/18/2021   HCT 49.3 10/18/2021   MCV 83 10/18/2021   PLT 382 10/18/2021   Lab Results  Component Value Date   ALT 38 02/16/2022   AST 25 02/16/2022   ALKPHOS 59 02/16/2022   BILITOT 0.3 02/16/2022   No results found for: "25OHVITD2", "25OHVITD3", "VD25OH"   Review of Systems  Constitutional:  Negative for chills and fever.   HENT:  Negative for ear discharge, ear pain and sore throat.   Eyes:  Negative for blurred vision.  Respiratory:  Negative for cough, shortness of breath and wheezing.   Cardiovascular:  Negative for chest pain, palpitations, orthopnea, leg swelling and PND.  Gastrointestinal:  Negative for abdominal pain, blood in stool, constipation, diarrhea and nausea.  Endocrine: Negative for polydipsia.  Genitourinary:  Negative for difficulty urinating, dysuria, frequency, hematuria and urgency.  Musculoskeletal:  Negative for arthralgias, back pain, myalgias and neck pain.  Skin:  Negative for rash.  Allergic/Immunologic: Negative for environmental allergies.  Neurological:  Negative for dizziness and headaches.  Hematological:  Does not bruise/bleed easily.  Psychiatric/Behavioral:  Negative for suicidal ideas. The patient is nervous/anxious.     Patient Active Problem List   Diagnosis Date Noted   Meningitis 07/22/2019    No Known Allergies  Past Surgical History:  Procedure Laterality Date   TONSILLECTOMY      Social History   Tobacco Use   Smoking status: Never   Smokeless tobacco: Never  Vaping Use   Vaping Use: Never used  Substance Use Topics   Alcohol use: Yes   Drug use: Yes    Types: Marijuana     Medication list has been reviewed and updated.  Current Meds  Medication Sig   atorvastatin (LIPITOR) 10 MG tablet TAKE 1 TABLET BY MOUTH EVERY DAY   lisinopril-hydrochlorothiazide (ZESTORETIC) 10-12.5 MG tablet TAKE 1 TAB BY MOUTH DAILY  07/13/2022    9:20 AM 11/15/2021   10:54 AM 10/18/2021   10:27 AM 08/04/2020    2:25 PM  GAD 7 : Generalized Anxiety Score  Nervous, Anxious, on Edge 1 0 0 1  Control/stop worrying 1 0 0 1  Worry too much - different things 1 0 0 1  Trouble relaxing 1 0 0 0  Restless 0 0 0 0  Easily annoyed or irritable 1 1 0 1  Afraid - awful might happen 0 0 0 1  Total GAD 7 Score 5 1 0 5  Anxiety Difficulty Somewhat difficult Not  difficult at all Not difficult at all Not difficult at all       07/13/2022    9:19 AM 11/15/2021   10:54 AM 10/18/2021   10:26 AM  Depression screen PHQ 2/9  Decreased Interest 1 0 0  Down, Depressed, Hopeless 1 0 0  PHQ - 2 Score 2 0 0  Altered sleeping 0 0 0  Tired, decreased energy 0 0 0  Change in appetite 1 0 0  Feeling bad or failure about yourself  0 0 0  Trouble concentrating 2 1 0  Moving slowly or fidgety/restless 0 0 0  Suicidal thoughts 0 0 0  PHQ-9 Score 5 1 0  Difficult doing work/chores Somewhat difficult Not difficult at all     BP Readings from Last 3 Encounters:  07/13/22 120/80  12/27/21 120/80  11/15/21 (!) 134/102    Physical Exam Vitals and nursing note reviewed.  Constitutional:      Appearance: He is obese.  HENT:     Head: Normocephalic.     Right Ear: Tympanic membrane and external ear normal.     Left Ear: Tympanic membrane and external ear normal.     Nose: Nose normal. No congestion or rhinorrhea.     Mouth/Throat:     Mouth: Mucous membranes are moist.     Pharynx: No oropharyngeal exudate or posterior oropharyngeal erythema.  Eyes:     General: No scleral icterus.       Right eye: No discharge.        Left eye: No discharge.     Conjunctiva/sclera: Conjunctivae normal.     Pupils: Pupils are equal, round, and reactive to light.  Neck:     Thyroid: No thyromegaly.     Vascular: No JVD.     Trachea: No tracheal deviation.  Cardiovascular:     Rate and Rhythm: Normal rate and regular rhythm.     Heart sounds: Normal heart sounds. No murmur heard.    No friction rub. No gallop.  Pulmonary:     Effort: No respiratory distress.     Breath sounds: Normal breath sounds. No wheezing, rhonchi or rales.  Abdominal:     General: Bowel sounds are normal.     Palpations: Abdomen is soft. There is no mass.     Tenderness: There is no abdominal tenderness. There is no right CVA tenderness, left CVA tenderness, guarding or rebound.   Musculoskeletal:        General: No tenderness. Normal range of motion.     Cervical back: Normal range of motion and neck supple.  Lymphadenopathy:     Cervical: No cervical adenopathy.  Skin:    General: Skin is warm.     Findings: No rash.  Neurological:     Mental Status: He is alert.     Cranial Nerves: No cranial nerve deficit.     Wt  Readings from Last 3 Encounters:  07/13/22 (!) 338 lb (153.3 kg)  12/27/21 (!) 352 lb (159.7 kg)  11/15/21 (!) 358 lb (162.4 kg)    BP 120/80   Pulse 84   Ht _0  (1.753 m)   Wt (!) 338 lb (153.3 kg)   BMI 49.91 kg/m   Assessment and Plan:  1. Essential hypertension Chronic.  Controlled.  Stable.  Blood pressure 120/80.  Asymptomatic.  Tolerating medication.  Continue lisinopril hydrochlorothiazide 10-12.5 mg once a day.  Will check renal function panel for GFR and electrolytes. - lisinopril-hydrochlorothiazide (ZESTORETIC) 10-12.5 MG tablet; Take 1 tablet by mouth daily.  Dispense: 90 tablet; Refill: 1 - Renal Function Panel  2. Mixed hyperlipidemia Chronic.  Controlled.  Stable.  Continue atorvastatin 10 mg once a day.  Will check lipid panel. - atorvastatin (LIPITOR) 10 MG tablet; Take 1 tablet (10 mg total) by mouth daily.  Dispense: 90 tablet; Refill: 1 - Lipid Panel With LDL/HDL Ratio  3. Attention deficit hyperactivity disorder (ADHD), combined type Probably in existence for several years.  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  Symptoms are suggestive of inattention and referral to psychiatry for evaluation and treatment for ADHD.  There is no medical contraindications to stimulants if necessary. - Ambulatory referral to Psychiatry

## 2022-07-14 LAB — RENAL FUNCTION PANEL
Albumin: 4.7 g/dL (ref 4.3–5.2)
BUN/Creatinine Ratio: 10 (ref 9–20)
BUN: 11 mg/dL (ref 6–20)
CO2: 26 mmol/L (ref 20–29)
Calcium: 9.9 mg/dL (ref 8.7–10.2)
Chloride: 99 mmol/L (ref 96–106)
Creatinine, Ser: 1.08 mg/dL (ref 0.76–1.27)
Glucose: 89 mg/dL (ref 70–99)
Phosphorus: 3.8 mg/dL (ref 2.8–4.1)
Potassium: 4.9 mmol/L (ref 3.5–5.2)
Sodium: 140 mmol/L (ref 134–144)
eGFR: 95 mL/min/{1.73_m2} (ref >59)

## 2022-07-14 LAB — LIPID PANEL WITH LDL/HDL RATIO
Cholesterol, Total: 169 mg/dL (ref 100–199)
HDL: 33 mg/dL — ABNORMAL LOW (ref >39)
LDL Chol Calc (NIH): 94 mg/dL (ref 0–99)
LDL/HDL Ratio: 2.8 ratio (ref 0.0–3.6)
Triglycerides: 246 mg/dL — ABNORMAL HIGH (ref 0–149)
VLDL Cholesterol Cal: 42 mg/dL — ABNORMAL HIGH (ref 5–40)

## 2022-07-16 ENCOUNTER — Other Ambulatory Visit: Payer: Self-pay

## 2022-07-16 DIAGNOSIS — E781 Pure hyperglyceridemia: Secondary | ICD-10-CM

## 2022-07-16 MED ORDER — FENOFIBRATE 145 MG PO TABS: 145.0000 mg | ORAL_TABLET | Freq: Every day | ORAL | 1 refills | Status: DC

## 2022-07-16 NOTE — Progress Notes (Signed)
Sent in med to [facility]

## 2022-08-06 ENCOUNTER — Telehealth: Payer: Self-pay

## 2022-08-06 NOTE — Telephone Encounter (Signed)
Called pt- he has been asked to call [facility] back as I have received a cancellation of referral due to "try to call twice and no answer"

## 2022-08-07 ENCOUNTER — Other Ambulatory Visit: Payer: Self-pay | Admitting: Family Medicine

## 2022-08-07 DIAGNOSIS — E781 Pure hyperglyceridemia: Secondary | ICD-10-CM

## 2022-08-07 NOTE — Telephone Encounter (Signed)
Requested Prescriptions  Pending Prescriptions Disp Refills  . fenofibrate (TRICOR) 145 MG tablet [Pharmacy Med Name: FENOFIBRATE 145 MG TABLET] 30 tablet 1    Sig: TAKE 1 TABLET BY MOUTH EVERY DAY     Cardiovascular:  Antilipid - Fibric Acid Derivatives Failed - 08/07/2022 10:32 AM      Failed - Lipid Panel in normal range within the last 12 months    Cholesterol, Total  Date Value Ref Range Status  07/13/2022 169 100 - 199 mg/dL Final   LDL Chol Calc (NIH)  Date Value Ref Range Status  07/13/2022 94 0 - 99 mg/dL Final   HDL  Date Value Ref Range Status  07/13/2022 33 (L) >39 mg/dL Final   Triglycerides  Date Value Ref Range Status  07/13/2022 246 (H) 0 - 149 mg/dL Final         Passed - ALT in normal range and within 360 days    ALT  Date Value Ref Range Status  02/16/2022 38 0 - 44 IU/L Final         Passed - AST in normal range and within 360 days    AST  Date Value Ref Range Status  02/16/2022 25 0 - 40 IU/L Final         Passed - Cr in normal range and within 360 days    Creatinine, Ser  Date Value Ref Range Status  07/13/2022 1.08 0.76 - 1.27 mg/dL Final         Passed - HGB in normal range and within 360 days    Hemoglobin  Date Value Ref Range Status  10/18/2021 16.3 13.0 - 17.7 g/dL Final         Passed - HCT in normal range and within 360 days    Hematocrit  Date Value Ref Range Status  10/18/2021 49.3 37.5 - 51.0 % Final         Passed - PLT in normal range and within 360 days    Platelets  Date Value Ref Range Status  10/18/2021 382 150 - 450 x10E3/uL Final         Passed - WBC in normal range and within 360 days    WBC  Date Value Ref Range Status  10/18/2021 9.2 3.4 - 10.8 x10E3/uL Final  09/24/2021 13.6 (H) 4.0 - 10.5 K/uL Final         Passed - eGFR is 30 or above and within 360 days    GFR calc Af Amer  Date Value Ref Range Status  07/27/2019 >60 >60 mL/min Final   GFR, Estimated  Date Value Ref Range Status  09/24/2021 >60  >60 mL/min Final    Comment:    (NOTE) Calculated using the CKD-EPI Creatinine Equation (2021)    eGFR  Date Value Ref Range Status  07/13/2022 95 >59 mL/min/1.73 Final         Passed - Valid encounter within last 12 months    Recent Outpatient Visits          3 weeks ago Essential hypertension   [place], Deanna C, MD   7 months ago Essential hypertension   [place], Deanna C, MD   8 months ago Essential hypertension   [place], Deanna C, MD   9 months ago Essential hypertension   [facility] Juline Patch, MD   2 years ago Establishing care with new doctor, encounter for   [place],  Iven Finn, MD      Future Appointments            In 5 months Juline Patch, MD  [facility]

## 2022-08-14 ENCOUNTER — Telehealth: Payer: Self-pay | Admitting: Family Medicine

## 2022-08-14 NOTE — Telephone Encounter (Signed)
[facility] called and spoke to [place], [facility] about the refill(s) Atorvastatin requested. Advised it was sent on 07/13/22 #90/1 refill(s). She says it was probably returned to stock, but they can go ahead and refill.

## 2022-08-14 NOTE — Telephone Encounter (Signed)
Copied from CRM [id number]. Topic: General - Other >> Aug 14, 2022  3:46 PM Kenneth Craig wrote: Reason for CRM: Medication Refill - Medication: atorvastatin (LIPITOR) 10 MG tablet [[id number]]   Has the patient contacted their pharmacy? Yes.  The patient has been directed to contact their PCP, they waited too long to pick up their medication  (Agent: If no, request that the patient contact the pharmacy for the refill. If patient does not wish to contact the pharmacy document the reason why and proceed with request.) (Agent: If yes, when and what did the pharmacy advise?)  Preferred Pharmacy (with phone number or street name): [id number] [place], [place] - [address] [place] [address] Phone: [phone] Fax: [phone] Hours: Not open 24 hours   Has the patient been seen for an appointment in the last year OR does the patient have an upcoming appointment? Yes.    Agent: Please be advised that RX refills may take up to 3 business days. We ask that you follow-up with your pharmacy.

## 2022-08-15 DIAGNOSIS — Z79899 Other long term (current) drug therapy: Secondary | ICD-10-CM | POA: Diagnosis not present

## 2022-08-15 DIAGNOSIS — F900 Attention-deficit hyperactivity disorder, predominantly inattentive type: Secondary | ICD-10-CM | POA: Diagnosis not present

## 2022-08-15 DIAGNOSIS — R69 Illness, unspecified: Secondary | ICD-10-CM | POA: Diagnosis not present

## 2022-08-31 DIAGNOSIS — R69 Illness, unspecified: Secondary | ICD-10-CM | POA: Diagnosis not present

## 2022-09-07 ENCOUNTER — Other Ambulatory Visit: Payer: Self-pay | Admitting: Family Medicine

## 2022-09-07 DIAGNOSIS — E781 Pure hyperglyceridemia: Secondary | ICD-10-CM

## 2022-09-28 DIAGNOSIS — R69 Illness, unspecified: Secondary | ICD-10-CM | POA: Diagnosis not present

## 2022-10-07 ENCOUNTER — Other Ambulatory Visit: Payer: Self-pay | Admitting: Family Medicine

## 2022-10-07 DIAGNOSIS — E781 Pure hyperglyceridemia: Secondary | ICD-10-CM

## 2022-10-26 DIAGNOSIS — R69 Illness, unspecified: Secondary | ICD-10-CM | POA: Diagnosis not present

## 2022-11-12 ENCOUNTER — Other Ambulatory Visit: Payer: Self-pay | Admitting: Family Medicine

## 2022-11-12 DIAGNOSIS — E781 Pure hyperglyceridemia: Secondary | ICD-10-CM

## 2022-12-15 ENCOUNTER — Other Ambulatory Visit: Payer: Self-pay | Admitting: Family Medicine

## 2022-12-15 DIAGNOSIS — E781 Pure hyperglyceridemia: Secondary | ICD-10-CM

## 2023-01-11 ENCOUNTER — Encounter: Payer: Self-pay | Admitting: Family Medicine

## 2023-01-11 ENCOUNTER — Ambulatory Visit: Payer: 59 | Admitting: Family Medicine

## 2023-01-11 DIAGNOSIS — I10 Essential (primary) hypertension: Secondary | ICD-10-CM | POA: Diagnosis not present

## 2023-01-11 DIAGNOSIS — E782 Mixed hyperlipidemia: Secondary | ICD-10-CM | POA: Diagnosis not present

## 2023-01-11 DIAGNOSIS — E781 Pure hyperglyceridemia: Secondary | ICD-10-CM

## 2023-01-11 MED ORDER — ATORVASTATIN CALCIUM 10 MG PO TABS: 10.0000 mg | ORAL_TABLET | Freq: Every day | ORAL | 1 refills | Status: DC

## 2023-01-11 MED ORDER — LISINOPRIL-HYDROCHLOROTHIAZIDE 20-25 MG PO TABS: 1.0000 | ORAL_TABLET | Freq: Every day | ORAL | 3 refills | Status: DC

## 2023-01-11 MED ORDER — FENOFIBRATE 145 MG PO TABS: ORAL_TABLET | ORAL | 1 refills | Status: DC

## 2023-01-11 NOTE — Patient Instructions (Signed)

## 2023-01-11 NOTE — Progress Notes (Signed)
Date:  01/11/2023   Name:  Kenneth Craig   DOB:  01-06-1991   MRN:  [id number]   Chief Complaint: Hypertension and Hyperlipidemia  Hypertension This is a chronic problem. The current episode started more than 1 year ago. The problem has been gradually improving since onset. The problem is uncontrolled. Pertinent negatives include no chest pain, headaches, neck pain, palpitations or shortness of breath.  Hyperlipidemia Pertinent negatives include no chest pain, myalgias or shortness of breath.    Lab Results  Component Value Date   NA 140 07/13/2022   K 4.9 07/13/2022   CO2 26 07/13/2022   GLUCOSE 89 07/13/2022   BUN 11 07/13/2022   CREATININE 1.08 07/13/2022   CALCIUM 9.9 07/13/2022   EGFR 95 07/13/2022   GFRNONAA >60 09/24/2021   Lab Results  Component Value Date   CHOL 169 07/13/2022   HDL 33 (L) 07/13/2022   LDLCALC 94 07/13/2022   TRIG 246 (H) 07/13/2022   No results found for: "TSH" No results found for: "HGBA1C" Lab Results  Component Value Date   WBC 9.2 10/18/2021   HGB 16.3 10/18/2021   HCT 49.3 10/18/2021   MCV 83 10/18/2021   PLT 382 10/18/2021   Lab Results  Component Value Date   ALT 38 02/16/2022   AST 25 02/16/2022   ALKPHOS 59 02/16/2022   BILITOT 0.3 02/16/2022   No results found for: "25OHVITD2", "25OHVITD3", "VD25OH"   Review of Systems  Constitutional:  Negative for chills and fever.  HENT:  Negative for drooling, ear discharge, ear pain and sore throat.   Respiratory:  Negative for cough, shortness of breath and wheezing.   Cardiovascular:  Negative for chest pain, palpitations and leg swelling.  Gastrointestinal:  Negative for abdominal pain, blood in stool, constipation, diarrhea and nausea.  Endocrine: Negative for polydipsia.  Genitourinary:  Negative for dysuria, frequency, hematuria and urgency.  Musculoskeletal:  Negative for back pain, myalgias and neck pain.  Skin:  Negative for rash.  Allergic/Immunologic: Negative for  environmental allergies.  Neurological:  Negative for dizziness and headaches.  Hematological:  Does not bruise/bleed easily.  Psychiatric/Behavioral:  Negative for suicidal ideas. The patient is not nervous/anxious.     Patient Active Problem List   Diagnosis Date Noted   Meningitis 07/22/2019    No Known Allergies  Past Surgical History:  Procedure Laterality Date   TONSILLECTOMY      Social History   Tobacco Use   Smoking status: Never   Smokeless tobacco: Never  Vaping Use   Vaping Use: Never used  Substance Use Topics   Alcohol use: Yes   Drug use: Yes    Types: Marijuana     Medication list has been reviewed and updated.  Current Meds  Medication Sig   atorvastatin (LIPITOR) 10 MG tablet Take 1 tablet (10 mg total) by mouth daily.   dextroamphetamine (DEXEDRINE SPANSULE) 10 MG 24 hr capsule Take 10 mg by mouth every morning.   dextroamphetamine (DEXEDRINE SPANSULE) 15 MG 24 hr capsule Take 15 mg by mouth every morning.   fenofibrate (TRICOR) 145 MG tablet TAKE 1 TABLET BY MOUTH EVERY DAY. *NEEDS LABS*   lisinopril-hydrochlorothiazide (ZESTORETIC) 10-12.5 MG tablet Take 1 tablet by mouth daily.       01/11/2023    9:13 AM 07/13/2022    9:20 AM 11/15/2021   10:54 AM 10/18/2021   10:27 AM  GAD 7 : Generalized Anxiety Score  Nervous, Anxious, on Edge 0 1 0 0  Control/stop worrying 0 1 0 0  Worry too much - different things 0 1 0 0  Trouble relaxing 0 1 0 0  Restless 0 0 0 0  Easily annoyed or irritable 0 1 1 0  Afraid - awful might happen 0 0 0 0  Total GAD 7 Score 0 5 1 0  Anxiety Difficulty Not difficult at all Somewhat difficult Not difficult at all Not difficult at all       01/11/2023    9:13 AM 07/13/2022    9:19 AM 11/15/2021   10:54 AM  Depression screen PHQ 2/9  Decreased Interest 0 1 0  Down, Depressed, Hopeless 0 1 0  PHQ - 2 Score 0 2 0  Altered sleeping 0 0 0  Tired, decreased energy 0 0 0  Change in appetite 0 1 0  Feeling bad or  failure about yourself  0 0 0  Trouble concentrating 0 2 1  Moving slowly or fidgety/restless 0 0 0  Suicidal thoughts 0 0 0  PHQ-9 Score 0 5 1  Difficult doing work/chores Not difficult at all Somewhat difficult Not difficult at all    BP Readings from Last 3 Encounters:  01/11/23 (!) 140/92  07/13/22 120/80  12/27/21 120/80    Physical Exam Vitals reviewed.  HENT:     Head: Normocephalic.     Right Ear: Tympanic membrane and external ear normal.     Left Ear: Tympanic membrane and external ear normal.     Nose: Nose normal.     Mouth/Throat:     Mouth: Mucous membranes are moist.  Eyes:     General: No scleral icterus.       Right eye: No discharge.        Left eye: No discharge.     Conjunctiva/sclera: Conjunctivae normal.     Pupils: Pupils are equal, round, and reactive to light.  Neck:     Thyroid: No thyromegaly.     Vascular: No JVD.     Trachea: No tracheal deviation.  Cardiovascular:     Rate and Rhythm: Normal rate and regular rhythm.     Heart sounds: Normal heart sounds. No murmur heard.    No friction rub. No gallop.  Pulmonary:     Effort: No respiratory distress.     Breath sounds: Normal breath sounds. No wheezing, rhonchi or rales.  Chest:     Chest wall: No tenderness.  Abdominal:     General: Bowel sounds are normal.     Palpations: Abdomen is soft. There is no mass.     Tenderness: There is no abdominal tenderness. There is no guarding or rebound.  Musculoskeletal:        General: No tenderness. Normal range of motion.     Cervical back: Normal range of motion and neck supple.  Lymphadenopathy:     Cervical: No cervical adenopathy.  Skin:    General: Skin is warm.     Findings: No rash.  Neurological:     Mental Status: He is alert.     Wt Readings from Last 3 Encounters:  01/11/23 (!) 326 lb (147.9 kg)  07/13/22 (!) 338 lb (153.3 kg)  12/27/21 (!) 352 lb (159.7 kg)    BP (!) 140/92 (BP Location: Left Arm, Cuff Size: Large)    Pulse 67   Ht 5\' 9"  (1.753 m)   Wt (!) 326 lb (147.9 kg)   SpO2 97%   BMI 48.14 kg/m   Assessment and  Plan:  1. Mixed hyperlipidemia Chronic.  Controlled.  Stable.  Patient given Dash diet for weight loss and control of dietary cholesterol triglycerides.  Will continue atorvastatin 10 mg once a day with refill and recheck lipids in 10 weeks. - atorvastatin (LIPITOR) 10 MG tablet; Take 1 tablet (10 mg total) by mouth daily.  Dispense: 90 tablet; Refill: 1  2. Hypertriglyceridemia As noted above  3. Essential hypertension Chronic.  Uncontrolled.  Stable.  Blood pressure 140/92.  Asymptomatic.  Will increase lisinopril to lisinopril hydrochlorothiazide 20-25 mg once a day and will recheck in 10 weeks.  At that time we will obtain a renal panel in 10 weeks for electrolytes and GFR. - lisinopril-hydrochlorothiazide (ZESTORETIC) 20-25 MG tablet; Take 1 tablet by mouth daily.  Dispense: 90 tablet; Refill: 3    Otilio Miu, MD

## 2023-01-14 ENCOUNTER — Ambulatory Visit: Payer: 59 | Admitting: Family Medicine

## 2023-01-25 ENCOUNTER — Other Ambulatory Visit: Payer: Self-pay | Admitting: Family Medicine

## 2023-01-25 DIAGNOSIS — I10 Essential (primary) hypertension: Secondary | ICD-10-CM

## 2023-02-06 ENCOUNTER — Encounter: Payer: Self-pay | Admitting: Family Medicine

## 2023-03-22 ENCOUNTER — Ambulatory Visit: Payer: BC Managed Care – PPO | Admitting: Family Medicine

## 2023-03-22 ENCOUNTER — Encounter: Payer: Self-pay | Admitting: Family Medicine

## 2023-03-22 VITALS — BP 126/72 | HR 72 | Ht 69.0 in | Wt 325.0 lb

## 2023-03-22 DIAGNOSIS — I10 Essential (primary) hypertension: Secondary | ICD-10-CM | POA: Diagnosis not present

## 2023-03-22 DIAGNOSIS — E782 Mixed hyperlipidemia: Secondary | ICD-10-CM

## 2023-03-22 NOTE — Progress Notes (Signed)
Date:  03/22/2023   Name:  Kenneth Craig   DOB:  04-Jul-1991   MRN:  [id number]   Chief Complaint: Hypertension and Hyperlipidemia  Hypertension This is a chronic problem. The current episode started more than 1 year ago. The problem has been gradually improving since onset. The problem is controlled. Pertinent negatives include no blurred vision, chest pain, headaches, orthopnea, palpitations, PND or shortness of breath. There are no associated agents to hypertension. There are no known risk factors for coronary artery disease. Past treatments include ACE inhibitors and diuretics. The current treatment provides moderate improvement. There are no compliance problems.  There is no history of CAD/MI or CVA. There is no history of chronic renal disease, a hypertension causing med or renovascular disease.  Hyperlipidemia This is a chronic problem. The current episode started more than 1 year ago. Recent lipid tests were reviewed and are normal. He has no history of chronic renal disease or diabetes. Pertinent negatives include no chest pain or shortness of breath. Current antihyperlipidemic treatment includes statins. The current treatment provides moderate improvement of lipids. There are no compliance problems.     Lab Results  Component Value Date   NA 140 07/13/2022   K 4.9 07/13/2022   CO2 26 07/13/2022   GLUCOSE 89 07/13/2022   BUN 11 07/13/2022   CREATININE 1.08 07/13/2022   CALCIUM 9.9 07/13/2022   EGFR 95 07/13/2022   GFRNONAA >60 09/24/2021   Lab Results  Component Value Date   CHOL 169 07/13/2022   HDL 33 (L) 07/13/2022   LDLCALC 94 07/13/2022   TRIG 246 (H) 07/13/2022   No results found for: "TSH" No results found for: "HGBA1C" Lab Results  Component Value Date   WBC 9.2 10/18/2021   HGB 16.3 10/18/2021   HCT 49.3 10/18/2021   MCV 83 10/18/2021   PLT 382 10/18/2021   Lab Results  Component Value Date   ALT 38 02/16/2022   AST 25 02/16/2022   ALKPHOS 59  02/16/2022   BILITOT 0.3 02/16/2022   No results found for: "25OHVITD2", "25OHVITD3", "VD25OH"   Review of Systems  Constitutional:  Negative for unexpected weight change.  HENT:  Negative for trouble swallowing and voice change.   Eyes:  Negative for blurred vision and visual disturbance.  Respiratory:  Negative for cough, chest tightness, shortness of breath and wheezing.   Cardiovascular:  Negative for chest pain, palpitations, orthopnea, leg swelling and PND.  Gastrointestinal:  Negative for abdominal pain, blood in stool, constipation and diarrhea.  Endocrine: Negative for polydipsia and polyuria.  Genitourinary:  Negative for difficulty urinating.  Neurological:  Negative for headaches.    Patient Active Problem List   Diagnosis Date Noted   Meningitis 07/22/2019    No Known Allergies  Past Surgical History:  Procedure Laterality Date   TONSILLECTOMY      Social History   Tobacco Use   Smoking status: Never   Smokeless tobacco: Never  Vaping Use   Vaping Use: Never used  Substance Use Topics   Alcohol use: Yes   Drug use: Yes    Types: Marijuana     Medication list has been reviewed and updated.  Current Meds  Medication Sig   atorvastatin (LIPITOR) 10 MG tablet Take 1 tablet (10 mg total) by mouth daily.   fenofibrate (TRICOR) 145 MG tablet TAKE 1 TABLET BY MOUTH EVERY DAY. *NEEDS LABS*   lisinopril-hydrochlorothiazide (ZESTORETIC) 20-25 MG tablet Take 1 tablet by mouth daily.  03/22/2023    9:13 AM 01/11/2023    9:13 AM 07/13/2022    9:20 AM 11/15/2021   10:54 AM  GAD 7 : Generalized Anxiety Score  Nervous, Anxious, on Edge 0 0 1 0  Control/stop worrying 0 0 1 0  Worry too much - different things 0 0 1 0  Trouble relaxing 0 0 1 0  Restless 0 0 0 0  Easily annoyed or irritable 0 0 1 1  Afraid - awful might happen 0 0 0 0  Total GAD 7 Score 0 0 5 1  Anxiety Difficulty Not difficult at all Not difficult at all Somewhat difficult Not difficult  at all       03/22/2023    9:12 AM 01/11/2023    9:13 AM 07/13/2022    9:19 AM  Depression screen PHQ 2/9  Decreased Interest 0 0 1  Down, Depressed, Hopeless 0 0 1  PHQ - 2 Score 0 0 2  Altered sleeping 0 0 0  Tired, decreased energy 0 0 0  Change in appetite 0 0 1  Feeling bad or failure about yourself  0 0 0  Trouble concentrating 0 0 2  Moving slowly or fidgety/restless 0 0 0  Suicidal thoughts 0 0 0  PHQ-9 Score 0 0 5  Difficult doing work/chores Not difficult at all Not difficult at all Somewhat difficult    BP Readings from Last 3 Encounters:  03/22/23 126/72  01/11/23 (!) 140/92  07/13/22 120/80    Physical Exam Vitals and nursing note reviewed.  HENT:     Head: Normocephalic.     Right Ear: Tympanic membrane and external ear normal.     Left Ear: Tympanic membrane and external ear normal.     Nose: Nose normal.     Mouth/Throat:     Mouth: Mucous membranes are moist.  Eyes:     General: No scleral icterus.       Right eye: No discharge.        Left eye: No discharge.     Conjunctiva/sclera: Conjunctivae normal.     Pupils: Pupils are equal, round, and reactive to light.  Neck:     Thyroid: No thyromegaly.     Vascular: No JVD.     Trachea: No tracheal deviation.  Cardiovascular:     Rate and Rhythm: Normal rate and regular rhythm.     Heart sounds: Normal heart sounds. No murmur heard.    No friction rub. No gallop.  Pulmonary:     Effort: No respiratory distress.     Breath sounds: Normal breath sounds. No wheezing, rhonchi or rales.  Abdominal:     General: Bowel sounds are normal.     Palpations: Abdomen is soft. There is no mass.     Tenderness: There is no abdominal tenderness. There is no guarding or rebound.  Musculoskeletal:        General: No tenderness. Normal range of motion.     Cervical back: Normal range of motion and neck supple.  Lymphadenopathy:     Cervical: No cervical adenopathy.  Skin:    General: Skin is warm.     Capillary  Refill: Capillary refill takes less than 2 seconds.     Findings: No rash.  Neurological:     General: No focal deficit present.     Mental Status: He is alert and oriented to person, place, and time.     Cranial Nerves: No cranial nerve deficit.  Coordination: Coordination normal.     Deep Tendon Reflexes: Reflexes are normal and symmetric.     Wt Readings from Last 3 Encounters:  03/22/23 (!) 325 lb (147.4 kg)  01/11/23 (!) 326 lb (147.9 kg)  07/13/22 (!) 338 lb (153.3 kg)    BP 126/72   Pulse 72   Ht 5\' 9"  (1.753 m)   Wt (!) 325 lb (147.4 kg)   SpO2 97%   BMI 47.99 kg/m   Assessment and Plan: 1. Essential hypertension Chronic.  Controlled.  Stable.  Blood pressure today 126/72.  Asymptomatic.  Tolerating medications well.  Continue current dosing of lisinopril hydrochlorothiazide 20-25 mg once a day.  Will check CMP for electrolytes and GFR.  Will recheck patient in 6 months. - Comprehensive Metabolic Panel (CMET)  2. Mixed hyperlipidemia Chronic.  Controlled.  Stable.  Currently controlled on atorvastatin 10 mg and fenofibrate 145 mg each once a day.  Will check lipid panel for current status of LDL.Marland Kitchen - Lipid Panel With LDL/HDL Ratio     Otilio Miu, MD

## 2023-03-23 LAB — COMPREHENSIVE METABOLIC PANEL
ALT: 42 IU/L (ref 0–44)
AST: 27 IU/L (ref 0–40)
Albumin/Globulin Ratio: 1.7 (ref 1.2–2.2)
Albumin: 4.7 g/dL (ref 4.3–5.2)
Alkaline Phosphatase: 31 IU/L — ABNORMAL LOW (ref 44–121)
BUN/Creatinine Ratio: 13 (ref 9–20)
BUN: 20 mg/dL (ref 6–20)
Bilirubin Total: 0.4 mg/dL (ref 0.0–1.2)
CO2: 23 mmol/L (ref 20–29)
Calcium: 9.9 mg/dL (ref 8.7–10.2)
Chloride: 100 mmol/L (ref 96–106)
Creatinine, Ser: 1.52 mg/dL — ABNORMAL HIGH (ref 0.76–1.27)
Globulin, Total: 2.7 g/dL (ref 1.5–4.5)
Glucose: 88 mg/dL (ref 70–99)
Potassium: 4.4 mmol/L (ref 3.5–5.2)
Sodium: 140 mmol/L (ref 134–144)
Total Protein: 7.4 g/dL (ref 6.0–8.5)
eGFR: 63 mL/min/{1.73_m2} (ref >59)

## 2023-03-23 LAB — LIPID PANEL WITH LDL/HDL RATIO
Cholesterol, Total: 181 mg/dL (ref 100–199)
HDL: 42 mg/dL (ref >39)
LDL Chol Calc (NIH): 117 mg/dL — ABNORMAL HIGH (ref 0–99)
LDL/HDL Ratio: 2.8 ratio (ref 0.0–3.6)
Triglycerides: 123 mg/dL (ref 0–149)
VLDL Cholesterol Cal: 22 mg/dL (ref 5–40)

## 2023-03-26 ENCOUNTER — Encounter: Payer: Self-pay | Admitting: Family Medicine

## 2023-07-02 ENCOUNTER — Other Ambulatory Visit: Payer: Self-pay | Admitting: Family Medicine

## 2023-07-02 DIAGNOSIS — E782 Mixed hyperlipidemia: Secondary | ICD-10-CM

## 2023-07-12 ENCOUNTER — Ambulatory Visit (INDEPENDENT_AMBULATORY_CARE_PROVIDER_SITE_OTHER): Payer: BC Managed Care – PPO | Admitting: Family Medicine

## 2023-07-12 ENCOUNTER — Encounter: Payer: Self-pay | Admitting: Family Medicine

## 2023-07-12 VITALS — BP 102/62 | HR 70 | Ht 69.0 in | Wt 326.0 lb

## 2023-07-12 DIAGNOSIS — E782 Mixed hyperlipidemia: Secondary | ICD-10-CM

## 2023-07-12 DIAGNOSIS — I10 Essential (primary) hypertension: Secondary | ICD-10-CM | POA: Diagnosis not present

## 2023-07-12 MED ORDER — FENOFIBRATE 145 MG PO TABS
ORAL_TABLET | ORAL | 1 refills | Status: DC
Start: 2023-07-12 — End: 2023-07-22

## 2023-07-12 MED ORDER — ATORVASTATIN CALCIUM 10 MG PO TABS: 10.0000 mg | ORAL_TABLET | Freq: Every day | ORAL | 1 refills | Status: DC

## 2023-07-12 MED ORDER — LISINOPRIL-HYDROCHLOROTHIAZIDE 20-25 MG PO TABS
1.0000 | ORAL_TABLET | Freq: Every day | ORAL | 1 refills | Status: DC
Start: 2023-07-12 — End: 2023-10-16

## 2023-07-12 NOTE — Progress Notes (Signed)
Date:  07/12/2023   Name:  Kenneth Craig   DOB:  05-12-1991   MRN:  [id number]   Chief Complaint: Hyperlipidemia and Hypertension  HPI  Lab Results  Component Value Date   NA 140 03/22/2023   K 4.4 03/22/2023   CO2 23 03/22/2023   GLUCOSE 88 03/22/2023   BUN 20 03/22/2023   CREATININE 1.52 (H) 03/22/2023   CALCIUM 9.9 03/22/2023   EGFR 63 03/22/2023   GFRNONAA >60 09/24/2021   Lab Results  Component Value Date   CHOL 181 03/22/2023   HDL 42 03/22/2023   LDLCALC 117 (H) 03/22/2023   TRIG 123 03/22/2023   No results found for: "TSH" No results found for: "HGBA1C" Lab Results  Component Value Date   WBC 9.2 10/18/2021   HGB 16.3 10/18/2021   HCT 49.3 10/18/2021   MCV 83 10/18/2021   PLT 382 10/18/2021   Lab Results  Component Value Date   ALT 42 03/22/2023   AST 27 03/22/2023   ALKPHOS 31 (L) 03/22/2023   BILITOT 0.4 03/22/2023   No results found for: "25OHVITD2", "25OHVITD3", "VD25OH"   Review of Systems  Patient Active Problem List   Diagnosis Date Noted   Meningitis 07/22/2019    No Known Allergies  Past Surgical History:  Procedure Laterality Date   TONSILLECTOMY      Social History   Tobacco Use   Smoking status: Never   Smokeless tobacco: Never  Vaping Use   Vaping status: Never Used  Substance Use Topics   Alcohol use: Yes   Drug use: Yes    Types: Marijuana     Medication list has been reviewed and updated.  Current Meds  Medication Sig   [DISCONTINUED] atorvastatin (LIPITOR) 10 MG tablet TAKE 1 TABLET BY MOUTH EVERY DAY   [DISCONTINUED] fenofibrate (TRICOR) 145 MG tablet TAKE 1 TABLET BY MOUTH EVERY DAY. *NEEDS LABS*   [DISCONTINUED] lisinopril-hydrochlorothiazide (ZESTORETIC) 20-25 MG tablet Take 1 tablet by mouth daily.       03/22/2023    9:13 AM 01/11/2023    9:13 AM 07/13/2022    9:20 AM 11/15/2021   10:54 AM  GAD 7 : Generalized Anxiety Score  Nervous, Anxious, on Edge 0 0 1 0  Control/stop worrying 0 0 1 0   Worry too much - different things 0 0 1 0  Trouble relaxing 0 0 1 0  Restless 0 0 0 0  Easily annoyed or irritable 0 0 1 1  Afraid - awful might happen 0 0 0 0  Total GAD 7 Score 0 0 5 1  Anxiety Difficulty Not difficult at all Not difficult at all Somewhat difficult Not difficult at all       03/22/2023    9:12 AM 01/11/2023    9:13 AM 07/13/2022    9:19 AM  Depression screen PHQ 2/9  Decreased Interest 0 0 1  Down, Depressed, Hopeless 0 0 1  PHQ - 2 Score 0 0 2  Altered sleeping 0 0 0  Tired, decreased energy 0 0 0  Change in appetite 0 0 1  Feeling bad or failure about yourself  0 0 0  Trouble concentrating 0 0 2  Moving slowly or fidgety/restless 0 0 0  Suicidal thoughts 0 0 0  PHQ-9 Score 0 0 5  Difficult doing work/chores Not difficult at all Not difficult at all Somewhat difficult    BP Readings from Last 3 Encounters:  07/12/23 102/62  03/22/23 126/72  01/11/23 (!) 140/92    Physical Exam  Wt Readings from Last 3 Encounters:  07/12/23 (!) 326 lb (147.9 kg)  03/22/23 (!) 325 lb (147.4 kg)  01/11/23 (!) 326 lb (147.9 kg)    BP 102/62   Pulse 70   Ht 5\' 9"  (1.753 m)   Wt (!) 326 lb (147.9 kg)   SpO2 97%   BMI 48.14 kg/m   Assessment and Plan:  1. Essential hypertension See written note - lisinopril-hydrochlorothiazide (ZESTORETIC) 20-25 MG tablet; Take 1 tablet by mouth daily.  Dispense: 90 tablet; Refill: 1 - Renal Function Panel  2. Mixed hyperlipidemia  - atorvastatin (LIPITOR) 10 MG tablet; Take 1 tablet (10 mg total) by mouth daily.  Dispense: 90 tablet; Refill: 1 - fenofibrate (TRICOR) 145 MG tablet; TAKE 1 TABLET BY MOUTH EVERY DAY.  Dispense: 90 tablet; Refill: 1 - Lipid Panel With LDL/HDL Ratio    Elizabeth Sauer, MD

## 2023-07-22 ENCOUNTER — Other Ambulatory Visit: Payer: Self-pay | Admitting: Family Medicine

## 2023-07-22 DIAGNOSIS — E782 Mixed hyperlipidemia: Secondary | ICD-10-CM

## 2023-07-26 DIAGNOSIS — I10 Essential (primary) hypertension: Secondary | ICD-10-CM | POA: Diagnosis not present

## 2023-07-26 DIAGNOSIS — E782 Mixed hyperlipidemia: Secondary | ICD-10-CM | POA: Diagnosis not present

## 2023-08-17 ENCOUNTER — Other Ambulatory Visit: Payer: Self-pay | Admitting: Family Medicine

## 2023-08-17 DIAGNOSIS — E782 Mixed hyperlipidemia: Secondary | ICD-10-CM

## 2023-08-25 IMAGING — CT CT RENAL STONE PROTOCOL
2 of 4 series · 16 of 46 positions shown, 18 images · non-contrast
Comparison: None.

CLINICAL DATA: Right flank pain.

EXAM:
CT ABDOMEN AND PELVIS WITHOUT CONTRAST
TECHNIQUE: Multidetector CT imaging of the abdomen and pelvis was performed
following the standard protocol without IV contrast.

[Series 2: stone full standard · axial · 0.98mm/px · z∈[-956,-440]mm · 13 of 113 slices shown, 15 images]
[im 5/113  soft-tissue]
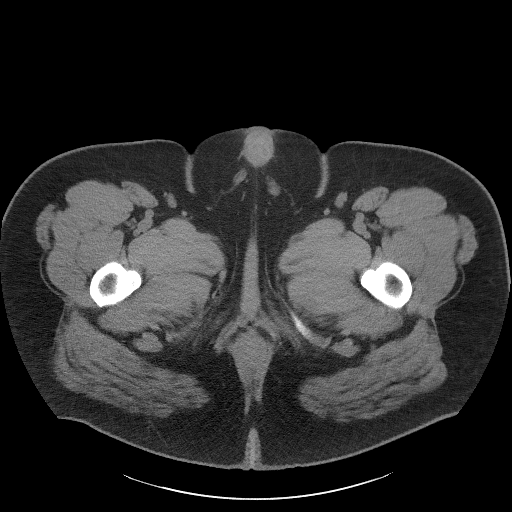
[im 5/113  bone]
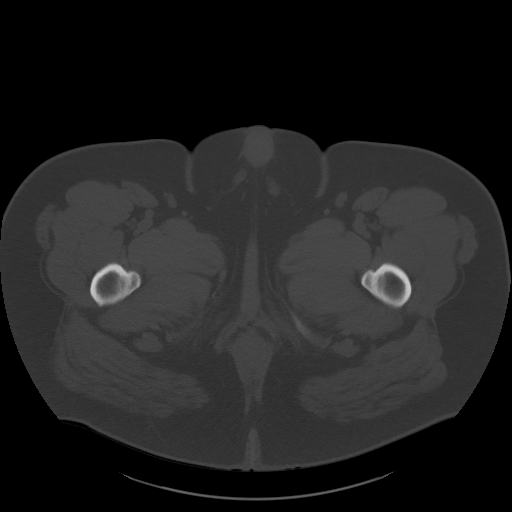
[im 15/113  soft-tissue]
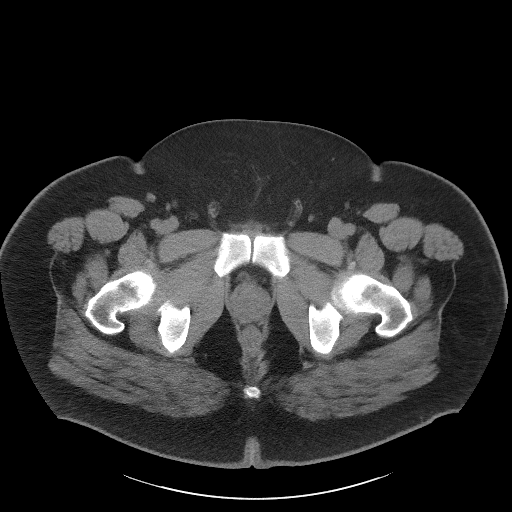
[im 25/113  soft-tissue]
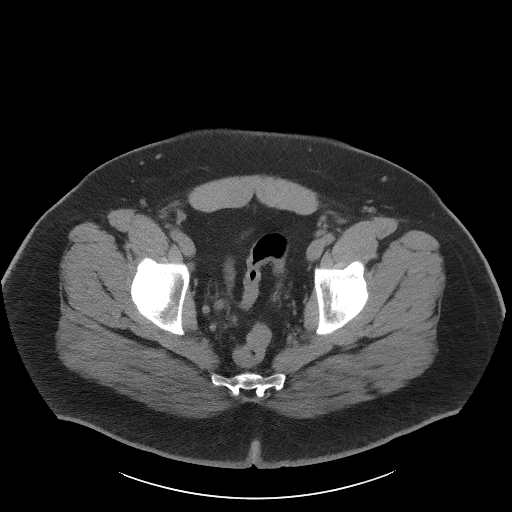
[im 30/113  soft-tissue]
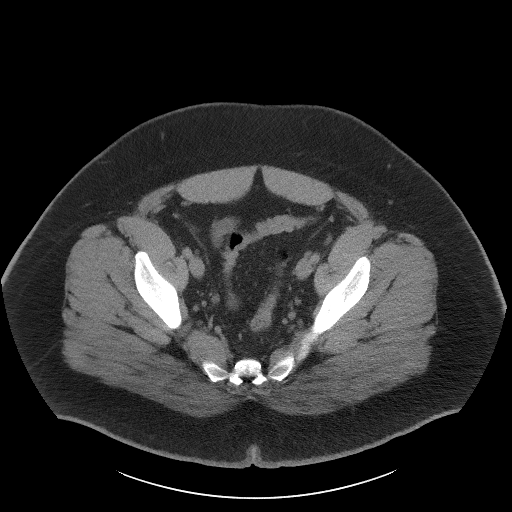
[im 39/113  soft-tissue]
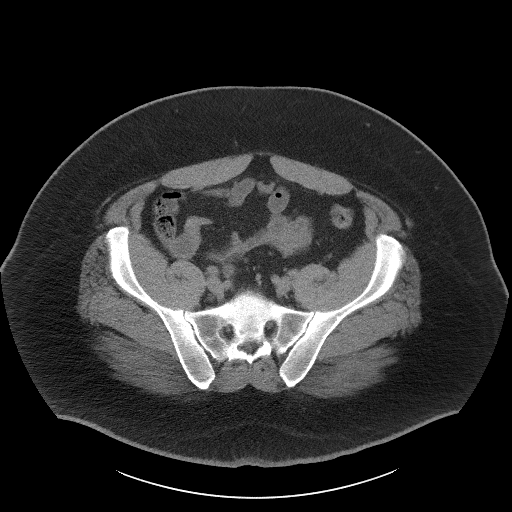
[im 49/113  soft-tissue]
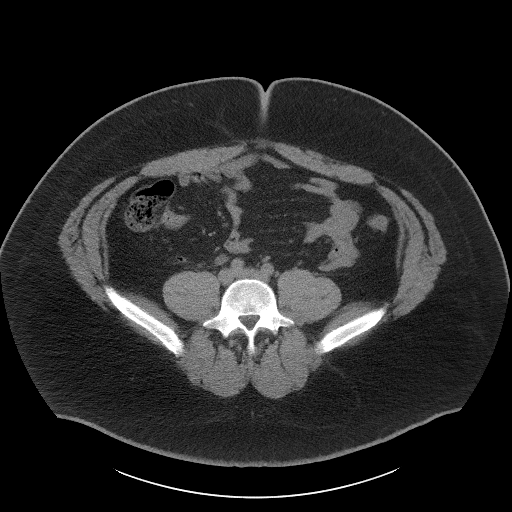
[im 59/113  soft-tissue]
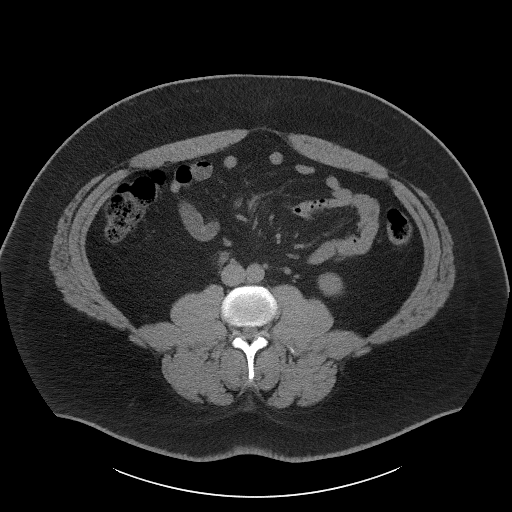
[im 64/113  soft-tissue]
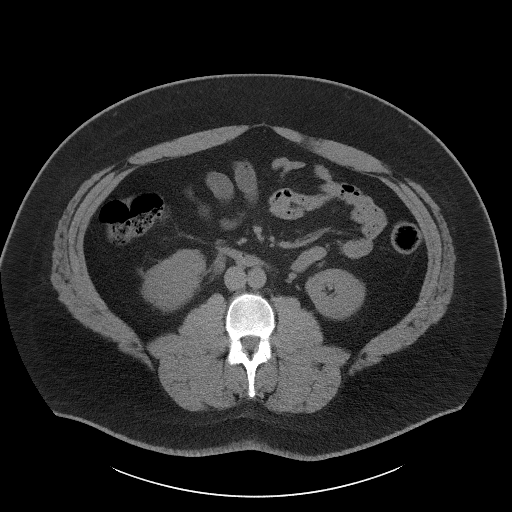
[im 74/113  soft-tissue]
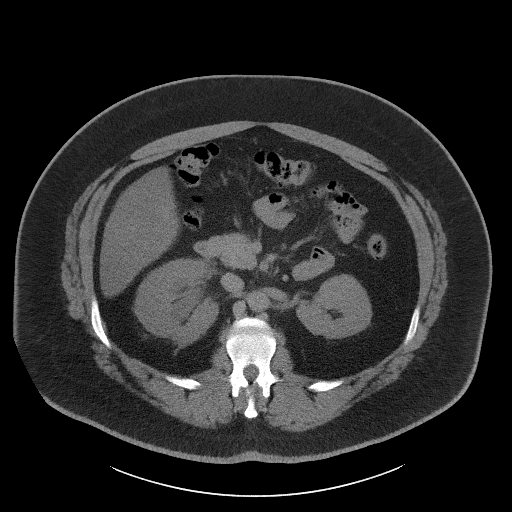
[im 74/113  bone]
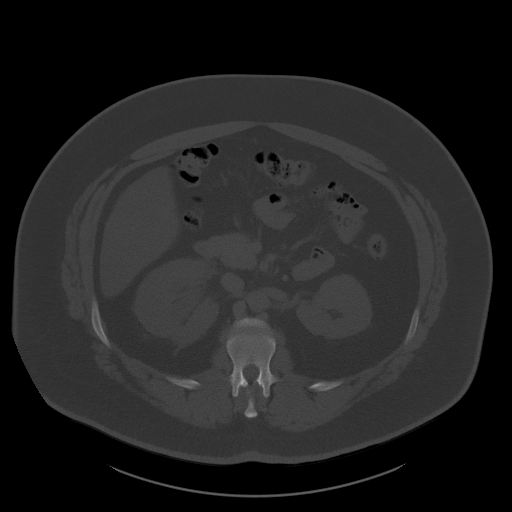
[im 83/113  soft-tissue]
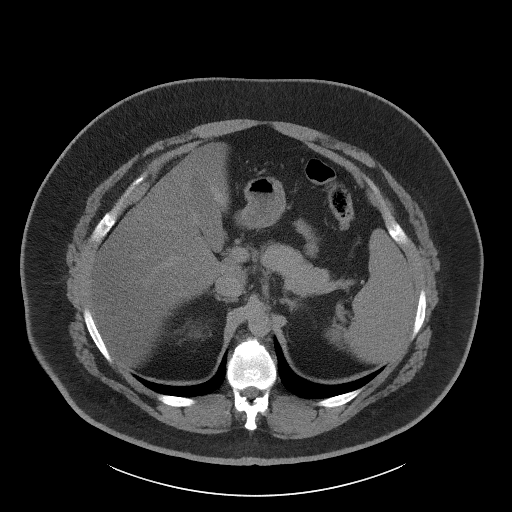
[im 88/113  soft-tissue]
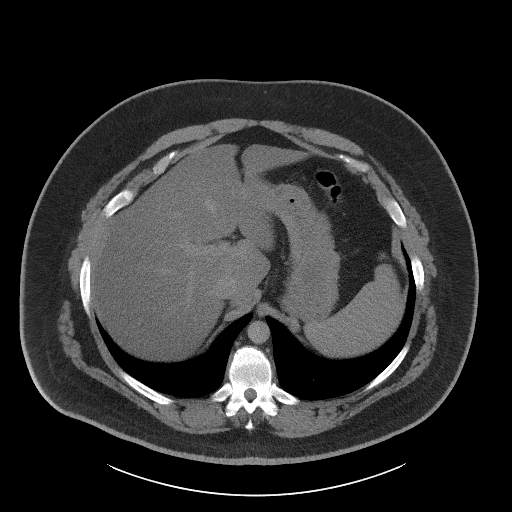
[im 98/113  soft-tissue]
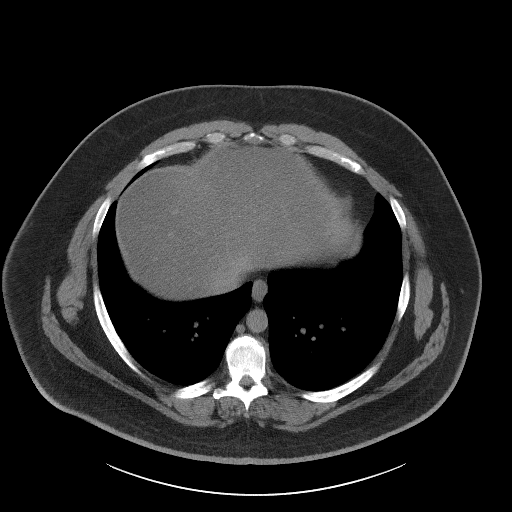
[im 108/113  soft-tissue]
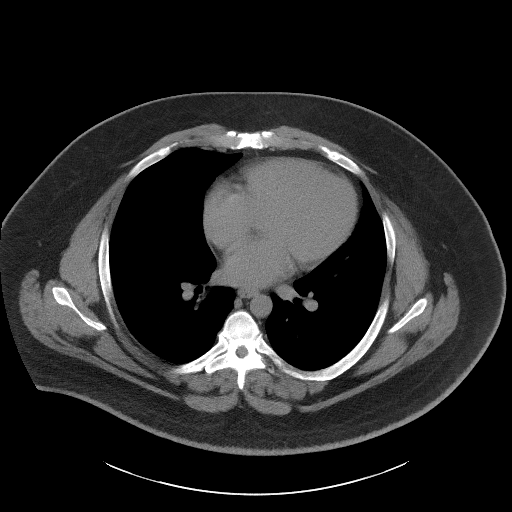

[Series 5: coronal · coronal · 0.99mm/px · 3 of 206 slices shown]
[im 69/206  soft-tissue]
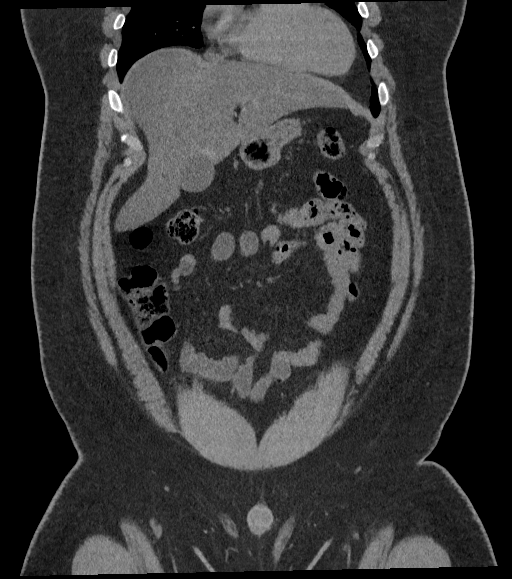
[im 92/206  soft-tissue]
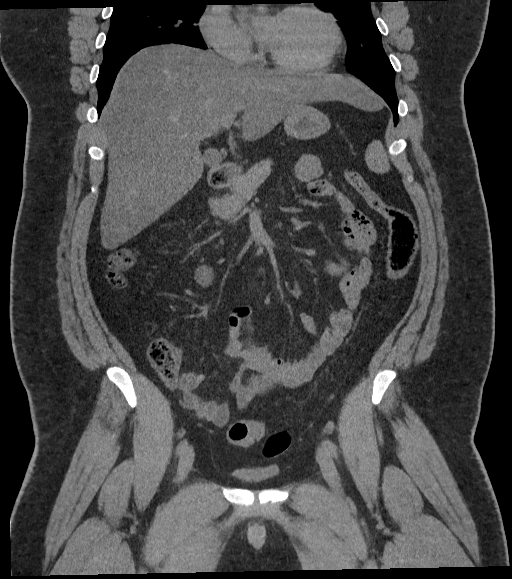
[im 114/206  soft-tissue]
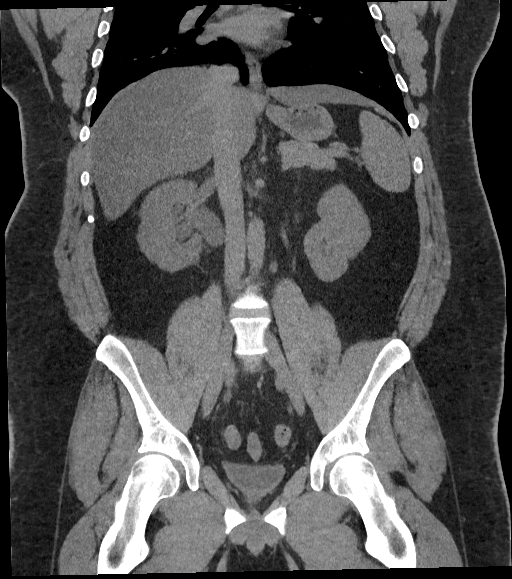

[16 of 46 positions shown; findings below may reference images not displayed]

FINDINGS: Lower chest: No acute abnormality.

Hepatobiliary: There is diffuse fatty infiltration of the liver
parenchyma. No focal liver abnormality is seen. No gallstones,
gallbladder wall thickening, or biliary dilatation.

Pancreas: Unremarkable. No pancreatic ductal dilatation or
surrounding inflammatory changes.

Spleen: Normal in size without focal abnormality.

Adrenals/Urinary Tract: Adrenal glands are unremarkable. Kidneys are
normal in size, without focal lesions. An 8 mm nonobstructing renal
stone is seen within the mid right kidney. A 7 mm obstructing renal
stone is seen within the distal right ureter, near the right UVJ.
There is moderate to marked severity right-sided hydronephrosis and
hydroureter with mild right-sided perinephric inflammatory fat
stranding. Bladder is unremarkable.

Stomach/Bowel: Stomach is within normal limits. Appendix appears
normal. No evidence of bowel wall thickening, distention, or
inflammatory changes.

Vascular/Lymphatic: No significant vascular findings are present. No
enlarged abdominal or pelvic lymph nodes.

Reproductive: Prostate is unremarkable.

Other: No abdominal wall hernia or abnormality. No abdominopelvic
ascites.

Musculoskeletal: Loss of vertebral body height is seen within the
T10, T11 and T12 vertebral bodies. This is of indeterminate age.
IMPRESSION: 1. 7 mm obstructing renal stone within the distal right ureter.
2. Nonobstructing 8 mm renal stone within the mid right kidney.
3. Hepatic steatosis.

## 2023-09-15 ENCOUNTER — Other Ambulatory Visit: Payer: Self-pay | Admitting: Family Medicine

## 2023-09-15 DIAGNOSIS — E782 Mixed hyperlipidemia: Secondary | ICD-10-CM

## 2023-09-16 ENCOUNTER — Other Ambulatory Visit: Payer: Self-pay

## 2023-09-16 ENCOUNTER — Other Ambulatory Visit (HOSPITAL_COMMUNITY): Payer: Self-pay

## 2023-09-16 MED ORDER — ATORVASTATIN CALCIUM 10 MG PO TABS
10.0000 mg | ORAL_TABLET | Freq: Every day | ORAL | 1 refills | Status: DC
Start: 2023-09-16 — End: 2023-10-18
  Filled 2023-09-16 – 2023-09-22 (×2): qty 30, 30d supply, fill #0

## 2023-09-16 MED ORDER — FENOFIBRATE 145 MG PO TABS
145.0000 mg | ORAL_TABLET | Freq: Every day | ORAL | 1 refills | Status: DC
Start: 2023-09-16 — End: 2023-10-16
  Filled 2023-09-16 – 2023-09-22 (×2): qty 30, 30d supply, fill #0

## 2023-09-22 ENCOUNTER — Other Ambulatory Visit: Payer: Self-pay

## 2023-09-23 ENCOUNTER — Encounter: Payer: Self-pay | Admitting: Family Medicine

## 2023-09-27 ENCOUNTER — Ambulatory Visit: Payer: BC Managed Care – PPO | Admitting: Family Medicine

## 2023-10-16 ENCOUNTER — Other Ambulatory Visit: Payer: Self-pay | Admitting: Family Medicine

## 2023-10-16 DIAGNOSIS — E782 Mixed hyperlipidemia: Secondary | ICD-10-CM

## 2023-10-16 DIAGNOSIS — I10 Essential (primary) hypertension: Secondary | ICD-10-CM

## 2023-10-18 ENCOUNTER — Ambulatory Visit: Payer: BC Managed Care – PPO | Admitting: Family Medicine

## 2023-10-18 ENCOUNTER — Encounter: Payer: Self-pay | Admitting: Family Medicine

## 2023-10-18 VITALS — BP 112/78 | HR 84 | Ht 69.0 in | Wt 331.0 lb

## 2023-10-18 DIAGNOSIS — I10 Essential (primary) hypertension: Secondary | ICD-10-CM

## 2023-10-18 DIAGNOSIS — E782 Mixed hyperlipidemia: Secondary | ICD-10-CM

## 2023-10-18 MED ORDER — FENOFIBRATE 145 MG PO TABS: 145.0000 mg | ORAL_TABLET | Freq: Every day | ORAL | 1 refills | Status: AC

## 2023-10-18 MED ORDER — LISINOPRIL-HYDROCHLOROTHIAZIDE 20-25 MG PO TABS: 1.0000 | ORAL_TABLET | Freq: Every day | ORAL | 1 refills | Status: AC

## 2023-10-18 MED ORDER — ATORVASTATIN CALCIUM 10 MG PO TABS
10.0000 mg | ORAL_TABLET | Freq: Every day | ORAL | 1 refills | Status: AC
Start: 2023-10-18 — End: ?

## 2023-10-18 NOTE — Progress Notes (Signed)
Date:  10/18/2023   Name:  Kenneth Craig   DOB:  Nov 13, 1991   MRN:  [id number]   Chief Complaint: Hypertension and Hyperlipidemia  Hypertension This is a chronic problem. The current episode started more than 1 year ago. The problem has been gradually improving since onset. The problem is controlled. Pertinent negatives include no anxiety, blurred vision, chest pain, headaches, malaise/fatigue, neck pain, orthopnea, palpitations, peripheral edema, PND, shortness of breath or sweats. There are no associated agents to hypertension. There are no known risk factors for coronary artery disease. Past treatments include ACE inhibitors. The current treatment provides moderate improvement. There are no compliance problems.  There is no history of CAD/MI or CVA. There is no history of chronic renal disease, a hypertension causing med or renovascular disease.  Hyperlipidemia This is a chronic problem. The current episode started more than 1 year ago. The problem is controlled. Recent lipid tests were reviewed and are normal. He has no history of chronic renal disease, diabetes, hypothyroidism, liver disease, obesity or nephrotic syndrome. Pertinent negatives include no chest pain, focal sensory loss, leg pain, myalgias or shortness of breath. The current treatment provides moderate improvement of lipids. There are no compliance problems.     Lab Results  Component Value Date   NA 138 07/26/2023   K 4.6 07/26/2023   CO2 25 07/26/2023   GLUCOSE 82 07/26/2023   BUN 17 07/26/2023   CREATININE 1.33 (H) 07/26/2023   CALCIUM 9.9 07/26/2023   EGFR 74 07/26/2023   GFRNONAA >60 09/24/2021   Lab Results  Component Value Date   CHOL 182 07/26/2023   HDL 42 07/26/2023   LDLCALC 116 (H) 07/26/2023   TRIG 132 07/26/2023   No results found for: "TSH" No results found for: "HGBA1C" Lab Results  Component Value Date   WBC 9.2 10/18/2021   HGB 16.3 10/18/2021   HCT 49.3 10/18/2021   MCV 83 10/18/2021    PLT 382 10/18/2021   Lab Results  Component Value Date   ALT 42 03/22/2023   AST 27 03/22/2023   ALKPHOS 31 (L) 03/22/2023   BILITOT 0.4 03/22/2023   No results found for: "25OHVITD2", "25OHVITD3", "VD25OH"   Review of Systems  Constitutional:  Negative for chills, fever and malaise/fatigue.  HENT:  Negative for drooling, ear discharge, ear pain and sore throat.   Eyes:  Negative for blurred vision.  Respiratory:  Negative for apnea, cough, shortness of breath and wheezing.   Cardiovascular:  Negative for chest pain, palpitations, orthopnea, leg swelling and PND.  Gastrointestinal:  Negative for abdominal pain, blood in stool, constipation, diarrhea and nausea.  Endocrine: Negative for polydipsia.  Genitourinary:  Negative for dysuria, frequency, hematuria and urgency.  Musculoskeletal:  Negative for back pain, myalgias and neck pain.  Skin:  Negative for rash.  Allergic/Immunologic: Negative for environmental allergies.  Neurological:  Negative for dizziness and headaches.  Hematological:  Does not bruise/bleed easily.  Psychiatric/Behavioral:  Negative for suicidal ideas. The patient is not nervous/anxious.     Patient Active Problem List   Diagnosis Date Noted   Meningitis 07/22/2019    No Known Allergies  Past Surgical History:  Procedure Laterality Date   TONSILLECTOMY      Social History   Tobacco Use   Smoking status: Never   Smokeless tobacco: Never  Vaping Use   Vaping status: Never Used  Substance Use Topics   Alcohol use: Yes   Drug use: Yes    Types: Marijuana  Medication list has been reviewed and updated.  Current Meds  Medication Sig   atorvastatin (LIPITOR) 10 MG tablet Take 1 tablet (10 mg total) by mouth daily.   dextroamphetamine (DEXEDRINE SPANSULE) 10 MG 24 hr capsule Take 10 mg by mouth every morning.   dextroamphetamine (DEXEDRINE SPANSULE) 15 MG 24 hr capsule Take 15 mg by mouth every morning.   fenofibrate (TRICOR) 145 MG tablet  TAKE 1 TABLET BY MOUTH EVERY DAY. *NEEDS LABS*   lisinopril-hydrochlorothiazide (ZESTORETIC) 20-25 MG tablet TAKE 1 TABLET BY MOUTH EVERY DAY       10/18/2023    9:48 AM 03/22/2023    9:13 AM 01/11/2023    9:13 AM 07/13/2022    9:20 AM  GAD 7 : Generalized Anxiety Score  Nervous, Anxious, on Edge 0 0 0 1  Control/stop worrying 0 0 0 1  Worry too much - different things 0 0 0 1  Trouble relaxing 0 0 0 1  Restless 1 0 0 0  Easily annoyed or irritable 1 0 0 1  Afraid - awful might happen 0 0 0 0  Total GAD 7 Score 2 0 0 5  Anxiety Difficulty Not difficult at all Not difficult at all Not difficult at all Somewhat difficult       10/18/2023    9:48 AM 03/22/2023    9:12 AM 01/11/2023    9:13 AM  Depression screen PHQ 2/9  Decreased Interest 1 0 0  Down, Depressed, Hopeless 0 0 0  PHQ - 2 Score 1 0 0  Altered sleeping 0 0 0  Tired, decreased energy 1 0 0  Change in appetite 1 0 0  Feeling bad or failure about yourself  0 0 0  Trouble concentrating 1 0 0  Moving slowly or fidgety/restless 1 0 0  Suicidal thoughts 0 0 0  PHQ-9 Score 5 0 0  Difficult doing work/chores Not difficult at all Not difficult at all Not difficult at all    BP Readings from Last 3 Encounters:  10/18/23 112/78  07/12/23 102/62  03/22/23 126/72    Physical Exam Vitals and nursing note reviewed.  HENT:     Head: Normocephalic.     Right Ear: External ear normal.     Left Ear: External ear normal.     Nose: Nose normal.  Eyes:     General: No scleral icterus.       Right eye: No discharge.        Left eye: No discharge.     Conjunctiva/sclera: Conjunctivae normal.     Pupils: Pupils are equal, round, and reactive to light.  Neck:     Thyroid: No thyromegaly.     Vascular: No JVD.     Trachea: No tracheal deviation.  Cardiovascular:     Rate and Rhythm: Normal rate and regular rhythm.     Heart sounds: Normal heart sounds and S1 normal. No murmur heard.    No systolic murmur is present.      No diastolic murmur is present.     No friction rub. No gallop. No S3 or S4 sounds.  Pulmonary:     Effort: No respiratory distress.     Breath sounds: Normal breath sounds. No stridor. No wheezing, rhonchi or rales.  Abdominal:     General: Bowel sounds are normal.     Palpations: Abdomen is soft. There is no mass.     Tenderness: There is no abdominal tenderness. There is no guarding or  rebound.  Musculoskeletal:        General: No tenderness. Normal range of motion.     Cervical back: Normal range of motion and neck supple.     Right lower leg: No edema.     Left lower leg: No edema.  Lymphadenopathy:     Cervical: No cervical adenopathy.  Skin:    General: Skin is warm.     Findings: No rash.  Neurological:     Mental Status: He is alert and oriented to person, place, and time.     Cranial Nerves: No cranial nerve deficit.     Deep Tendon Reflexes: Reflexes are normal and symmetric.     Wt Readings from Last 3 Encounters:  10/18/23 (!) 331 lb (150.1 kg)  07/12/23 (!) 326 lb (147.9 kg)  03/22/23 (!) 325 lb (147.4 kg)    BP 112/78   Pulse 84   Ht 5\' 9"  (1.753 m)   Wt (!) 331 lb (150.1 kg)   SpO2 97%   BMI 48.88 kg/m   Assessment and Plan: 1. Essential hypertension Chronic.  Controlled.  Stable.  Blood pressure today 112/78.  Tolerating medications well.  Asymptomatic.  Continue lisinopril hydrochlorothiazide 20-25 mg once a day we will recheck patient in 6 months.  Review of renal function panel is acceptable at this time. - lisinopril-hydrochlorothiazide (ZESTORETIC) 20-25 MG tablet; Take 1 tablet by mouth daily.  Dispense: 90 tablet; Refill: 1  2. Mixed hyperlipidemia Chronic.  Controlled.  Stable.  Continue atorvastatin 10 mg once a day.  We will also continue fenofibrate 145 mg once a day.  Will check lipid panel and 6 months. - atorvastatin (LIPITOR) 10 MG tablet; Take 1 tablet (10 mg total) by mouth daily.  Dispense: 90 tablet; Refill: 1 - fenofibrate (TRICOR)  145 MG tablet; Take 1 tablet (145 mg total) by mouth daily.  Dispense: 90 tablet; Refill: 1     Elizabeth Sauer, MD

## 2023-12-10 DIAGNOSIS — L02412 Cutaneous abscess of left axilla: Secondary | ICD-10-CM | POA: Diagnosis not present

## 2023-12-12 ENCOUNTER — Ambulatory Visit: Payer: Self-pay

## 2023-12-12 ENCOUNTER — Emergency Department: Payer: BC Managed Care – PPO

## 2023-12-12 ENCOUNTER — Emergency Department
Admission: EM | Admit: 2023-12-12 | Discharge: 2023-12-12 | Disposition: A | Discharge status: Home / Self Care | Payer: BC Managed Care – PPO | Attending: Emergency Medicine | Admitting: Emergency Medicine

## 2023-12-12 ENCOUNTER — Other Ambulatory Visit: Payer: Self-pay

## 2023-12-12 DIAGNOSIS — I10 Essential (primary) hypertension: Secondary | ICD-10-CM | POA: Insufficient documentation

## 2023-12-12 DIAGNOSIS — R000 Tachycardia, unspecified: Secondary | ICD-10-CM | POA: Diagnosis not present

## 2023-12-12 DIAGNOSIS — L02213 Cutaneous abscess of chest wall: Secondary | ICD-10-CM | POA: Diagnosis not present

## 2023-12-12 DIAGNOSIS — N179 Acute kidney failure, unspecified: Secondary | ICD-10-CM | POA: Insufficient documentation

## 2023-12-12 DIAGNOSIS — R222 Localized swelling, mass and lump, trunk: Secondary | ICD-10-CM | POA: Diagnosis not present

## 2023-12-12 DIAGNOSIS — L03313 Cellulitis of chest wall: Secondary | ICD-10-CM | POA: Diagnosis not present

## 2023-12-12 LAB — CBC WITH DIFFERENTIAL/PLATELET
Abs Immature Granulocytes: 0.09 10*3/uL — ABNORMAL HIGH (ref 0.00–0.07)
Basophils Absolute: 0.1 10*3/uL (ref 0.0–0.1)
Basophils Relative: 0 %
Eosinophils Absolute: 0.4 10*3/uL (ref 0.0–0.5)
Eosinophils Relative: 2 %
HCT: 44.5 % (ref 39.0–52.0)
Hemoglobin: 14.8 g/dL (ref 13.0–17.0)
Immature Granulocytes: 1 %
Lymphocytes Relative: 14 %
Lymphs Abs: 2.4 10*3/uL (ref 0.7–4.0)
MCH: 28.8 pg (ref 26.0–34.0)
MCHC: 33.3 g/dL (ref 30.0–36.0)
MCV: 86.6 fL (ref 80.0–100.0)
Monocytes Absolute: 2.2 10*3/uL — ABNORMAL HIGH (ref 0.1–1.0)
Monocytes Relative: 13 %
Neutro Abs: 11.8 10*3/uL — ABNORMAL HIGH (ref 1.7–7.7)
Neutrophils Relative %: 70 %
Platelets: 399 10*3/uL (ref 150–400)
RBC: 5.14 MIL/uL (ref 4.22–5.81)
RDW: 12.5 % (ref 11.5–15.5)
WBC: 17 10*3/uL — ABNORMAL HIGH (ref 4.0–10.5)
nRBC: 0 % (ref 0.0–0.2)

## 2023-12-12 LAB — LACTIC ACID, PLASMA
Lactic Acid, Venous: 1 mmol/L (ref 0.5–1.9)
Lactic Acid, Venous: 1.1 mmol/L (ref 0.5–1.9)

## 2023-12-12 LAB — COMPREHENSIVE METABOLIC PANEL
ALT: 26 U/L (ref 0–44)
AST: 24 U/L (ref 15–41)
Albumin: 4.1 g/dL (ref 3.5–5.0)
Alkaline Phosphatase: 43 U/L (ref 38–126)
Anion gap: 14 (ref 5–15)
BUN: 36 mg/dL — ABNORMAL HIGH (ref 6–20)
CO2: 21 mmol/L — ABNORMAL LOW (ref 22–32)
Calcium: 9.4 mg/dL (ref 8.9–10.3)
Chloride: 100 mmol/L (ref 98–111)
Creatinine, Ser: 2.03 mg/dL — ABNORMAL HIGH (ref 0.61–1.24)
GFR, Estimated: 44 mL/min — ABNORMAL LOW (ref >60)
Glucose, Bld: 100 mg/dL — ABNORMAL HIGH (ref 70–99)
Potassium: 3.6 mmol/L (ref 3.5–5.1)
Sodium: 135 mmol/L (ref 135–145)
Total Bilirubin: 0.7 mg/dL (ref <1.2)
Total Protein: 7.9 g/dL (ref 6.5–8.1)

## 2023-12-12 MED ORDER — CEPHALEXIN 500 MG PO CAPS: 500.0000 mg | ORAL_CAPSULE | Freq: Two times a day (BID) | ORAL | 0 refills | Status: AC

## 2023-12-12 MED ORDER — FAMOTIDINE 20 MG PO TABS: 20.0000 mg | ORAL_TABLET | Freq: Every day | ORAL | 0 refills | Status: AC

## 2023-12-12 MED ORDER — SODIUM CHLORIDE 0.9 % IV SOLN
3.0000 g | Freq: Once | INTRAVENOUS | Status: AC
  Administered 2023-12-12: 3 g via INTRAVENOUS
  Filled 2023-12-12: qty 8

## 2023-12-12 MED ORDER — KETOROLAC TROMETHAMINE 15 MG/ML IJ SOLN
15.0000 mg | Freq: Once | INTRAMUSCULAR | Status: AC
  Administered 2023-12-12: 15 mg via INTRAVENOUS
  Filled 2023-12-12: qty 1

## 2023-12-12 MED ORDER — ONDANSETRON 4 MG PO TBDP: 4.0000 mg | ORAL_TABLET | Freq: Three times a day (TID) | ORAL | 0 refills | Status: AC | PRN

## 2023-12-12 MED ORDER — LIDOCAINE HCL (PF) 1 % IJ SOLN
5.0000 mL | Freq: Once | INTRAMUSCULAR | Status: AC
Start: 2023-12-12 — End: 2023-12-12
  Administered 2023-12-12: 5 mL via INTRADERMAL
  Filled 2023-12-12: qty 5

## 2023-12-12 MED ORDER — FAMOTIDINE IN NACL 20-0.9 MG/50ML-% IV SOLN
20.0000 mg | Freq: Once | INTRAVENOUS | Status: AC
  Administered 2023-12-12: 20 mg via INTRAVENOUS
  Filled 2023-12-12: qty 50

## 2023-12-12 MED ORDER — IOHEXOL 300 MG/ML  SOLN
100.0000 mL | Freq: Once | INTRAMUSCULAR | Status: AC | PRN
  Administered 2023-12-12: 70 mL via INTRAVENOUS

## 2023-12-12 MED ORDER — DOXYCYCLINE HYCLATE 100 MG PO TABS
100.0000 mg | ORAL_TABLET | Freq: Once | ORAL | Status: AC
  Administered 2023-12-12: 100 mg via ORAL
  Filled 2023-12-12: qty 1

## 2023-12-12 MED ORDER — SODIUM CHLORIDE 0.9 % IV BOLUS
500.0000 mL | Freq: Once | INTRAVENOUS | Status: AC
  Administered 2023-12-12: 500 mL via INTRAVENOUS

## 2023-12-12 NOTE — ED Provider Notes (Signed)
[facility] Provider Note    Event Date/Time   First MD Initiated Contact with Patient 12/12/23 [phone]     (approximate)   History   No chief complaint on file.   HPI  Kenneth Craig is a 32 y.o. male past medical history significant for hypertension, hyperlipidemia, obesity, who presents to the emergency department with a skin infection.  States that on Saturday noted a small bump to the left chest wall that he has been messing with and has popped in the past and went away.  States he had worsening redness and significant pain.  Was evaluated on Tuesday in the office and was started on doxycycline.  States that he has taken 4 doses of doxycycline for 2 days now.  Today had worsening lightheadedness so he came into the emergency department.  Denies any fever or chills.  Denies nausea or vomiting.  Normal urine output.  States that he feels like his rash has not worsened and has now opened up and has been draining.  Denies any IV drug use.  No history of MRSA.     Physical Exam   Triage Vital Signs: ED Triage Vitals  Encounter Vitals Group     BP 12/12/23 0643 (!) 138/102     Systolic BP Percentile --      Diastolic BP Percentile --      Pulse Rate 12/12/23 0643 (!) 101     Resp 12/12/23 0643 (!) 21     Temp 12/12/23 0643 97.9 F (36.6 C)     Temp src --      SpO2 12/12/23 0643 97 %     Weight 12/12/23 0643 (!) 326 lb (147.9 kg)     Height 12/12/23 0643 5\' 9"  (1.753 m)     Head Circumference --      Peak Flow --      Pain Score 12/12/23 0643 2     Pain Loc --      Pain Education --      Exclude from Growth Chart --     Most recent vital signs: Vitals:   12/12/23 0643 12/12/23 0924  BP: (!) 138/102 133/84  Pulse: (!) 101 90  Resp: (!) 21 18  Temp: 97.9 F (36.6 C)   SpO2: 97% 98%    Physical Exam Constitutional:      Appearance: He is well-developed.  HENT:     Head: Atraumatic.  Eyes:     Conjunctiva/sclera: Conjunctivae normal.   Cardiovascular:     Rate and Rhythm: Regular rhythm. Tachycardia present.  Pulmonary:     Effort: No respiratory distress.     Comments: Chest wall with significant erythema and warmth to the left lateral aspect.  Small 1 x 1 cm wound that appears to be draining.  No crepitance.  Some fluctuance to the left chest wall. Musculoskeletal:     Cervical back: Normal range of motion.  Skin:    General: Skin is warm.  Neurological:     Mental Status: He is alert. Mental status is at baseline.     IMPRESSION / MDM / ASSESSMENT AND PLAN / ED COURSE  I reviewed the triage vital signs and the nursing notes.  Differential diagnosis including abscess, cellulitis, MRSA.  Have a low suspicion for necrotizing soft tissue infection.  On arrival patient was afebrile but tachycardic and tachypneic.  Mildly elevated blood pressure.  On chart review patient was evaluated 2 days ago and started on doxycycline.  Plan  for 500 bolus of IV fluids, lab work and a CT scan with contrast to evaluate for abscess.  RADIOLOGY I independently reviewed imaging, my interpretation of imaging: CT chest with contrast -stranding to the left chest wall but no obvious underlying abscess but did not fully get the entire soft tissue on CT scan.  No signs of pneumonia.  No signs of gas.  LABS (all labs ordered are listed, but only abnormal results are displayed) Labs interpreted as -    Labs Reviewed  CBC WITH DIFFERENTIAL/PLATELET - Abnormal; Notable for the following components:      Result Value   WBC 17.0 (*)    Neutro Abs 11.8 (*)    Monocytes Absolute 2.2 (*)    Abs Immature Granulocytes 0.09 (*)    All other components within normal limits  COMPREHENSIVE METABOLIC PANEL - Abnormal; Notable for the following components:   CO2 21 (*)    Glucose, Bld 100 (*)    BUN 36 (*)    Creatinine, Ser 2.03 (*)    GFR, Estimated 44 (*)    All other components within normal limits  LACTIC ACID, PLASMA  LACTIC ACID,  PLASMA     MDM    Patient with significant leukocytosis.  CT scan with signs of soft tissue stranding consistent with cellulitis.  Bedside ultrasound concerning for underlying abscess.  Patient has had some drainage of the abscess however further incision and drainage with packing was placed.  Did have significant purulent drainage.  Does have acute kidney injury but states that he has not been eating or drinking anything in the past couple of days.  Given IV fluids in the emergency department and tolerating p.o.  Complaining of some upper abdominal pain likely secondary to some gastritis from doxycycline use without eating.  Given a dose of Pepcid.  On reevaluation tolerating p.o., orthostatic blood pressures are negative and states that he is feeling much better.  Feels like his soft tissue swelling has improved when compared to the past couple of days since draining and being on antibiotics.  Given a dose of IV Unasyn and another dose of doxycycline in the emergency department.  Discussed at length admission for IV antibiotics versus discharge home with close follow-up.  Ultimately patient wants to go home and continue on oral antibiotics, will add on Keflex.  Discussed need for close follow-up with primary care provider for wound recheck and to have the packing removed.  Discussed that he will need repeat labs given his kidney injury.  Given return precautions for any fever, worsening swelling or redness, lightheadedness or dizziness.   PROCEDURES:  Critical Care performed: No  .Incision and Drainage  Date/Time: 12/12/2023 9:46 AM  Performed by: Corena Herter, MD Authorized by: Corena Herter, MD   Consent:    Consent obtained:  Verbal   Consent given by:  Patient   Risks, benefits, and alternatives were discussed: yes     Risks discussed:  Bleeding, incomplete drainage, pain and infection   Alternatives discussed:  No treatment and delayed treatment Universal protocol:     Procedure explained and questions answered to patient or proxy's satisfaction: yes     Relevant documents present and verified: yes     Test results available : yes     Required blood products, implants, devices, and special equipment available: yes     Patient identity confirmed:  Verbally with patient, arm band and provided demographic data Location:    Type:  Abscess   Size:  2 x 3 cm   Location:  Trunk   Trunk location:  Chest Anesthesia:    Anesthesia method:  Local infiltration   Local anesthetic:  Lidocaine 1% w/o epi Procedure type:    Complexity:  Complex Procedure details:    Ultrasound guidance: yes     Incision types:  Single straight   Incision depth:  Subcutaneous   Wound management:  Probed and deloculated   Drainage:  Bloody and purulent   Drainage amount:  Moderate   Wound treatment:  Wound left open   Packing materials:  1/4 in iodoform gauze Post-procedure details:    Procedure completion:  Tolerated well, no immediate complications .Ultrasound ED Soft Tissue  Date/Time: 12/12/2023 10:20 AM  Performed by: Corena Herter, MD Authorized by: Corena Herter, MD   Procedure details:    Indications: localization of abscess and evaluate for cellulitis     Transverse view:  Visualized   Longitudinal view:  Visualized   Images: not archived     Limitations:  Body habitus Location:    Location: chest     Side:  Left Findings:     abscess present    cellulitis present    no foreign body present   Patient's presentation is most consistent with acute presentation with potential threat to life or bodily function.   MEDICATIONS ORDERED IN ED: Medications  Ampicillin-Sulbactam (UNASYN) 3 g in sodium chloride 0.9 % 100 mL IVPB (3 g Intravenous New Bag/Given 12/12/23 1003)  famotidine (PEPCID) IVPB 20 mg premix (20 mg Intravenous New Bag/Given 12/12/23 1000)  sodium chloride 0.9 % bolus 500 mL (500 mLs Intravenous New Bag/Given 12/12/23 0754)  ketorolac  (TORADOL) 15 MG/ML injection 15 mg (15 mg Intravenous Given 12/12/23 0755)  iohexol (OMNIPAQUE) 300 MG/ML solution 100 mL (70 mLs Intravenous Contrast Given 12/12/23 0804)  lidocaine (PF) (XYLOCAINE) 1 % injection 5 mL (5 mLs Intradermal Given by Other 12/12/23 0923)  doxycycline (VIBRA-TABS) tablet 100 mg (100 mg Oral Given 12/12/23 1003)    FINAL CLINICAL IMPRESSION(S) / ED DIAGNOSES   Final diagnoses:  Cellulitis of chest wall  Chest wall abscess  AKI (acute kidney injury) (HCC)     Rx / DC Orders   ED Discharge Orders          Ordered    cephALEXin (KEFLEX) 500 MG capsule  2 times daily        12/12/23 0951    famotidine (PEPCID) 20 MG tablet  Daily        12/12/23 0951    ondansetron (ZOFRAN-ODT) 4 MG disintegrating tablet  Every 8 hours PRN        12/12/23 0951             Note:  This document was prepared using Dragon voice recognition software and may include unintentional dictation errors.   Corena Herter, MD 12/12/23 1020

## 2023-12-12 NOTE — ED Triage Notes (Signed)
Pt has abscess to left side of chest, pt was seen at [facility] Tuesday morning and prescribed doxycycline, pt reports abscess has since started to drain and he developed sweating and fatigue. Large area of redness noted to side of chest wall with area in the middle with purulent drainage.

## 2023-12-12 NOTE — Telephone Encounter (Signed)
Summary: Rx clarfication   Pt seen at hospital today and was given cephALEXin (KEFLEX) 500 MG capsule, pt requesting clarification on medication. Pt inquiring whether he needs to stop taking the doxycycline and use that instead or if he is supposed to be taking both at once.      Left message to call back.

## 2023-12-12 NOTE — ED Notes (Signed)
See triage note  Presents with abscess area to right lateral breast area  States he was seen at [facility] and placed on antibiotics  States area is draining and he is able to put is arm down  But states he just feels bad  Diaphoretic and nauseated

## 2023-12-12 NOTE — Telephone Encounter (Signed)
  Chief Complaint: Medication Question Symptoms: NA Frequency: NA Pertinent Negatives: Patient denies NA Disposition: [] ED /[] Urgent Care (no appt availability in office) / [] Appointment(In office/virtual)/ []  [facility]/ [] Home Care/ [] Refused Recommended Disposition /[] [place] [facility]/ [x]  Follow-up with PCP Additional Notes:  Pt seen in ED, abscess excised. States was given Keflex, questioning if he should continue Doxycycline. Please advise. Pt states may leave VM.  ED note: "Ultimately patient wants to go home and continue on oral antibiotics, will add on Keflex."  Reason for Disposition  [1] Caller has URGENT medicine question about med that PCP or specialist prescribed AND [2] triager unable to answer question  Answer Assessment - Initial Assessment Questions 1. NAME of MEDICINE: "What medicine(s) are you calling about?"     ATBs 2. QUESTION: "What is your question?" (e.g., double dose of medicine, side effect)     Continue Doxycycline? 3. PRESCRIBER: "Who prescribed the medicine?" Reason: if prescribed by specialist, call should be referred to that group.     ED  Protocols used: Medication Question Call-A-AH

## 2023-12-12 NOTE — Discharge Instructions (Addendum)
You were seen in the emergency department for a skin infection and an abscess to your left chest wall.  You were given a dose of IV antibiotics in the emergency department and oral antibiotics with doxycycline.  You had an abscess that was drained in the emergency department and packing was placed.  You need to follow-up closely with your primary care physician for reevaluation of your skin infection and to have your packing removed.  Return to the emergency department if you have any fever, ongoing symptoms, progression of the cellulitis outside of the area that had skin marker.  Fever or lightheadedness.  It is importantly stay hydrated and drink plenty of fluids, your kidney function was slightly worse today than normal.  Likely due to not eating or drinking anything in the past couple of days and being dehydrated.  Continue to take your doxycycline, take your second dose this afternoon.  You are given additional antibiotics with Keflex to add on in addition to your doxycycline, you can take both of these twice a day.  Doxycycline - This medication can cause acid reflux.  It is important that you take it with food and drink plenty of water.  Do not lie down for 1 hour after taking this medication.  You are given a prescription for Pepcid which is an acid reducing medicine medication you can take for 7 days given your symptoms of acid reflux while you have been on doxycycline.  Pain control:  Ibuprofen (motrin/aleve/advil) - You can take 3 tablets (600 mg) every 6 hours as needed for pain/fever.  Acetaminophen (tylenol) - You can take 2 extra strength tablets (1000 mg) every 6 hours as needed for pain/fever.  You can alternate these medications or take them together.  Make sure you eat food/drink water when taking these medications.  Thank you for choosing [place] for your health care, it was my pleasure to care for you today!  Corena Herter, MD

## 2023-12-13 NOTE — Telephone Encounter (Signed)
Spoke with patient and informed him that he should continue to take both ABX.

## 2023-12-16 ENCOUNTER — Other Ambulatory Visit: Payer: Self-pay

## 2023-12-16 ENCOUNTER — Emergency Department
Admission: EM | Admit: 2023-12-16 | Discharge: 2023-12-16 | Disposition: A | Discharge status: Home / Self Care | Payer: BC Managed Care – PPO | Attending: Emergency Medicine | Admitting: Emergency Medicine

## 2023-12-16 ENCOUNTER — Encounter: Payer: BC Managed Care – PPO | Admitting: Family Medicine

## 2023-12-16 ENCOUNTER — Encounter: Payer: Self-pay | Admitting: Emergency Medicine

## 2023-12-16 VITALS — BP 112/72 | HR 88 | Ht 69.0 in | Wt 327.0 lb

## 2023-12-16 DIAGNOSIS — Z4801 Encounter for change or removal of surgical wound dressing: Secondary | ICD-10-CM | POA: Insufficient documentation

## 2023-12-16 DIAGNOSIS — I10 Essential (primary) hypertension: Secondary | ICD-10-CM | POA: Insufficient documentation

## 2023-12-16 DIAGNOSIS — Z09 Encounter for follow-up examination after completed treatment for conditions other than malignant neoplasm: Secondary | ICD-10-CM

## 2023-12-16 DIAGNOSIS — L02213 Cutaneous abscess of chest wall: Secondary | ICD-10-CM

## 2023-12-16 NOTE — Progress Notes (Signed)
 Date:  12/16/2023   Name:  Kenneth Craig   DOB:  [birth year]   MRN:  [id number]   Chief Complaint: Follow-up (Pt state he is here for ED follow up and for gauze removal from ED visit.)  HPI  Lab Results  Component Value Date   NA 135 12/12/2023   K 3.6 12/12/2023   CO2 21 (L) 12/12/2023   GLUCOSE 100 (H) 12/12/2023   BUN 36 (H) 12/12/2023   CREATININE 2.03 (H) 12/12/2023   CALCIUM 9.4 12/12/2023   EGFR 74 07/26/2023   GFRNONAA 44 (L) 12/12/2023   Lab Results  Component Value Date   CHOL 182 07/26/2023   HDL 42 07/26/2023   LDLCALC 116 (H) 07/26/2023   TRIG 132 07/26/2023   No results found for: "TSH" No results found for: "HGBA1C" Lab Results  Component Value Date   WBC 17.0 (H) 12/12/2023   HGB 14.8 12/12/2023   HCT 44.5 12/12/2023   MCV 86.6 12/12/2023   PLT 399 12/12/2023   Lab Results  Component Value Date   ALT 26 12/12/2023   AST 24 12/12/2023   ALKPHOS 43 12/12/2023   BILITOT 0.7 12/12/2023   No results found for: "25OHVITD2", "25OHVITD3", "VD25OH"   Review of Systems  Patient Active Problem List   Diagnosis Date Noted   Meningitis 07/22/2019    No Known Allergies  Past Surgical History:  Procedure Laterality Date   TONSILLECTOMY      Social History   Tobacco Use   Smoking status: Never   Smokeless tobacco: Never  Vaping Use   Vaping status: Never Used  Substance Use Topics   Alcohol use: Yes   Drug use: Yes    Types: Marijuana     Medication list has been reviewed and updated.  Current Meds  Medication Sig   atorvastatin (LIPITOR) 10 MG tablet Take 1 tablet (10 mg total) by mouth daily.   cephALEXin (KEFLEX) 500 MG capsule Take 1 capsule (500 mg total) by mouth 2 (two) times daily for 7 days.   dextroamphetamine (DEXEDRINE SPANSULE) 10 MG 24 hr capsule Take 10 mg by mouth every morning.   dextroamphetamine (DEXEDRINE SPANSULE) 15 MG 24 hr capsule Take 15 mg by mouth every morning.   doxycycline (VIBRA-TABS) 100 MG tablet  Take 100 mg by mouth 2 (two) times daily.   famotidine (PEPCID) 20 MG tablet Take 1 tablet (20 mg total) by mouth daily for 7 days.   fenofibrate (TRICOR) 145 MG tablet Take 1 tablet (145 mg total) by mouth daily.   lisinopril-hydrochlorothiazide (ZESTORETIC) 20-25 MG tablet Take 1 tablet by mouth daily.   ondansetron (ZOFRAN-ODT) 4 MG disintegrating tablet Take 1 tablet (4 mg total) by mouth every 8 (eight) hours as needed for nausea or vomiting.       12/16/2023    3:16 PM 10/18/2023    9:48 AM 03/22/2023    9:13 AM 01/11/2023    9:13 AM  GAD 7 : Generalized Anxiety Score  Nervous, Anxious, on Edge 1 0 0 0  Control/stop worrying 0 0 0 0  Worry too much - different things 0 0 0 0  Trouble relaxing 0 0 0 0  Restless 2 1 0 0  Easily annoyed or irritable 0 1 0 0  Afraid - awful might happen 0 0 0 0  Total GAD 7 Score 3 2 0 0  Anxiety Difficulty Somewhat difficult Not difficult at all Not difficult at all Not difficult at all  12/16/2023    3:16 PM 10/18/2023    9:48 AM 03/22/2023    9:12 AM  Depression screen PHQ 2/9  Decreased Interest 1 1 0  Down, Depressed, Hopeless 0 0 0  PHQ - 2 Score 1 1 0  Altered sleeping 0 0 0  Tired, decreased energy 1 1 0  Change in appetite 0 1 0  Feeling bad or failure about yourself  0 0 0  Trouble concentrating 1 1 0  Moving slowly or fidgety/restless 0 1 0  Suicidal thoughts 0 0 0  PHQ-9 Score 3 5 0  Difficult doing work/chores Somewhat difficult Not difficult at all Not difficult at all    BP Readings from Last 3 Encounters:  12/16/23 112/72  12/12/23 130/80  10/18/23 112/78    Physical Exam  Wt Readings from Last 3 Encounters:  12/16/23 (!) 327 lb (148.3 kg)  12/12/23 (!) 326 lb (147.9 kg)  10/18/23 (!) 331 lb (150.1 kg)    BP 112/72   Pulse 88   Ht 5\' 9"  (1.753 m)   Wt (!) 327 lb (148.3 kg)   SpO2 95%   BMI 48.29 kg/m   Assessment and Plan:     Elizabeth Sauer, MD

## 2023-12-16 NOTE — Discharge Instructions (Signed)
Please finish the antibiotics as we discussed.  Either see the surgeon or return to the ER if symptoms return/worsen.

## 2023-12-16 NOTE — ED Provider Notes (Signed)
[facility] Provider Note    Event Date/Time   First MD Initiated Contact with Patient 12/16/23 1725     (approximate)   History   Wound Check   HPI  Kenneth Craig is a 32 y.o. male  with history of hypertension and as listed in EMR presents to the emergency department for packing removal or packing change. I&D was performed here 4 days ago. Pain has improved and drainage has lessened. No fevers or new complaints. He is taking his antibiotic as prescribed.      Physical Exam   Triage Vital Signs: ED Triage Vitals  Encounter Vitals Group     BP 12/16/23 1709 130/60     Systolic BP Percentile --      Diastolic BP Percentile --      Pulse Rate 12/16/23 1709 77     Resp 12/16/23 1709 18     Temp 12/16/23 1709 98.4 F (36.9 C)     Temp src --      SpO2 12/16/23 1709 97 %     Weight 12/16/23 1708 (!) 327 lb (148.3 kg)     Height 12/16/23 1708 5\' 9"  (1.753 m)     Head Circumference --      Peak Flow --      Pain Score 12/16/23 1708 0     Pain Loc --      Pain Education --      Exclude from Growth Chart --     Most recent vital signs: Vitals:   12/16/23 1709  BP: 130/60  Pulse: 77  Resp: 18  Temp: 98.4 F (36.9 C)  SpO2: 97%    General: Awake, no distress.  CV:  Good peripheral perfusion.  Resp:  Normal effort.  Abd:  No distention.  Other:  Wound to left chest wall with dressing in place.  Erythema has recessed from markings with the surgical pin   ED Results / Procedures / Treatments   Labs (all labs ordered are listed, but only abnormal results are displayed) Labs Reviewed - No data to display   EKG  Not indicated.   RADIOLOGY  Image and radiology report reviewed and interpreted by me. Radiology report consistent with the same.  Not indicated.  PROCEDURES:  Critical Care performed: No  Procedures   MEDICATIONS ORDERED IN ED:  Medications - No data to display   IMPRESSION / MDM / ASSESSMENT AND PLAN / ED  COURSE   I have reviewed the triage note.  Differential diagnosis includes, but is not limited to, cellulitis, abscess, resolving cellulitis  Patient's presentation is most consistent with acute, uncomplicated illness.  32 year old male presenting to the emergency department for wound assessment.  Incision and drainage was performed here 4 days ago.  Patient states that he feels better today.  On exam, there is no tenderness with palpation over the areas that were previously marked with a surgical pen.  Erythema has nearly completely resolved.  There is no fluctuance felt around the incision site.  Packing was removed without return of any purulent drainage.  There was some serosanguineous drainage but no frank blood.  Wound was covered with 2 x 2 and wound care was discussed with the patient.  Because the initial area of cellulitis was quite large and abscess was deep, he was advised that it would be best if he were evaluated by surgeon if the area started to become tender and red again.  If he is unable to see  a Careers adviser and the area is painful and red he is to return to the emergency department.      FINAL CLINICAL IMPRESSION(S) / ED DIAGNOSES   Final diagnoses:  Encounter for recheck of abscess following incision and drainage     Rx / DC Orders   ED Discharge Orders     None        Note:  This document was prepared using Dragon voice recognition software and may include unintentional dictation errors.   Chinita Pester, FNP 12/16/23 2245    Pilar Jarvis, MD 12/16/23 [phone]

## 2023-12-16 NOTE — ED Triage Notes (Signed)
Patient to ED via POV for wound check. States he was seen on Thursday and had wound packed and told to come back today to be removed.

## 2024-01-03 ENCOUNTER — Ambulatory Visit: Payer: BC Managed Care – PPO | Admitting: Family Medicine

## 2024-03-04 DIAGNOSIS — R8290 Unspecified abnormal findings in urine: Secondary | ICD-10-CM | POA: Diagnosis not present

## 2024-03-04 DIAGNOSIS — Z113 Encounter for screening for infections with a predominantly sexual mode of transmission: Secondary | ICD-10-CM | POA: Diagnosis not present

## 2024-03-04 DIAGNOSIS — R300 Dysuria: Secondary | ICD-10-CM | POA: Diagnosis not present

## 2024-03-04 DIAGNOSIS — R030 Elevated blood-pressure reading, without diagnosis of hypertension: Secondary | ICD-10-CM | POA: Diagnosis not present

## 2024-04-10 ENCOUNTER — Encounter: Payer: Self-pay | Admitting: Family Medicine

## 2024-04-10 ENCOUNTER — Ambulatory Visit: Payer: Self-pay | Admitting: Family Medicine

## 2024-04-22 ENCOUNTER — Emergency Department
Admission: EM | Admit: 2024-04-22 | Discharge: 2024-04-22 | Disposition: A | Discharge status: Home / Self Care | Payer: Self-pay | Attending: Emergency Medicine | Admitting: Emergency Medicine

## 2024-04-22 DIAGNOSIS — I10 Essential (primary) hypertension: Secondary | ICD-10-CM | POA: Diagnosis not present

## 2024-04-22 DIAGNOSIS — Z87442 Personal history of urinary calculi: Secondary | ICD-10-CM | POA: Insufficient documentation

## 2024-04-22 DIAGNOSIS — N390 Urinary tract infection, site not specified: Secondary | ICD-10-CM | POA: Insufficient documentation

## 2024-04-22 DIAGNOSIS — R300 Dysuria: Secondary | ICD-10-CM | POA: Diagnosis not present

## 2024-04-22 LAB — CHLAMYDIA/NGC RT PCR (ARMC ONLY)
Chlamydia Tr: NOT DETECTED
N gonorrhoeae: NOT DETECTED

## 2024-04-22 LAB — URINALYSIS, ROUTINE W REFLEX MICROSCOPIC
Bilirubin Urine: NEGATIVE
Glucose, UA: NEGATIVE mg/dL
Ketones, ur: NEGATIVE mg/dL
Leukocytes,Ua: NEGATIVE
Nitrite: NEGATIVE
Protein, ur: NEGATIVE mg/dL
Specific Gravity, Urine: 1.002 — ABNORMAL LOW (ref 1.005–1.030)
Squamous Epithelial / HPF: 0 /HPF (ref 0–5)
pH: 6 (ref 5.0–8.0)

## 2024-04-22 LAB — CBC
HCT: 48.8 % (ref 39.0–52.0)
Hemoglobin: 15.9 g/dL (ref 13.0–17.0)
MCH: 27.8 pg (ref 26.0–34.0)
MCHC: 32.6 g/dL (ref 30.0–36.0)
MCV: 85.3 fL (ref 80.0–100.0)
Platelets: 331 10*3/uL (ref 150–400)
RBC: 5.72 MIL/uL (ref 4.22–5.81)
RDW: 12.5 % (ref 11.5–15.5)
WBC: 9.2 10*3/uL (ref 4.0–10.5)
nRBC: 0 % (ref 0.0–0.2)

## 2024-04-22 LAB — COMPREHENSIVE METABOLIC PANEL WITH GFR
ALT: 33 U/L (ref 0–44)
AST: 20 U/L (ref 15–41)
Albumin: 4.1 g/dL (ref 3.5–5.0)
Alkaline Phosphatase: 35 U/L — ABNORMAL LOW (ref 38–126)
Anion gap: 10 (ref 5–15)
BUN: 15 mg/dL (ref 6–20)
CO2: 25 mmol/L (ref 22–32)
Calcium: 9 mg/dL (ref 8.9–10.3)
Chloride: 99 mmol/L (ref 98–111)
Creatinine, Ser: 1.16 mg/dL (ref 0.61–1.24)
GFR, Estimated: >60 mL/min (ref >60)
Glucose, Bld: 86 mg/dL (ref 70–99)
Potassium: 3.7 mmol/L (ref 3.5–5.1)
Sodium: 134 mmol/L — ABNORMAL LOW (ref 135–145)
Total Bilirubin: 0.6 mg/dL (ref 0.0–1.2)
Total Protein: 7.3 g/dL (ref 6.5–8.1)

## 2024-04-22 MED ORDER — KETOROLAC TROMETHAMINE 30 MG/ML IJ SOLN
15.0000 mg | Freq: Once | INTRAMUSCULAR | Status: AC
  Administered 2024-04-22: 15 mg via INTRAMUSCULAR
  Filled 2024-04-22: qty 1

## 2024-04-22 MED ORDER — TAMSULOSIN HCL 0.4 MG PO CAPS: 0.4000 mg | ORAL_CAPSULE | Freq: Every day | ORAL | 0 refills | Status: AC

## 2024-04-22 MED ORDER — TAMSULOSIN HCL 0.4 MG PO CAPS
0.4000 mg | ORAL_CAPSULE | Freq: Once | ORAL | Status: AC
  Administered 2024-04-22: 0.4 mg via ORAL
  Filled 2024-04-22: qty 1

## 2024-04-22 MED ORDER — CEFDINIR 300 MG PO CAPS: 300.0000 mg | ORAL_CAPSULE | Freq: Two times a day (BID) | ORAL | 0 refills | Status: AC

## 2024-04-22 NOTE — Discharge Instructions (Addendum)
 Please follow-up with your urologist for further management of your symptoms.  If you have persistent dysuria and you feel like there is pain to your abdomen that is going up to your back, or if you have any fevers or nausea or vomiting, please return to the emergency department to get reassessed.  Please take the antibiotics as prescribed for your UTI.

## 2024-04-22 NOTE — ED Provider Notes (Signed)
 [facility] Provider Note    Event Date/Time   First MD Initiated Contact with Patient 04/22/24 [phone]     (approximate)   History   Dysuria   HPI  Kenneth Craig is a 33 y.o. male with history of hypertension, nephrolithiasis, presenting with burning with urination.  Patient states that he gets some burning at the end of his stream.  Thinks this is related to his nephrolithiasis.  States that he has had history of kidney stones in the past and has had similar symptoms.  He denies any flank pain, abdominal pain, back pain at the time.  States that he thought his urine was slightly pink-tinged earlier.  Denies any penile discharge or rash.  No fevers or nausea or vomiting.  States that he is sexually active but uses condoms or spermicide.  No prior history of STIs.  On independent chart review, he was seen in 2022 for pain, was noted to have a 7 mm obstructing renal stone in the distal right ureter, it was also noted that he had 8 mm renal stone in his right kidney     Physical Exam   Triage Vital Signs: ED Triage Vitals [04/22/24 1414]  Encounter Vitals Group     BP (!) 181/101     Systolic BP Percentile      Diastolic BP Percentile      Pulse Rate 76     Resp 18     Temp 98.6 F (37 C)     Temp src      SpO2 98 %     Weight      Height      Head Circumference      Peak Flow      Pain Score 6     Pain Loc      Pain Education      Exclude from Growth Chart     Most recent vital signs: Vitals:   04/22/24 1414  BP: (!) 181/101  Pulse: 76  Resp: 18  Temp: 98.6 F (37 C)  SpO2: 98%     General: Awake, no distress.  Well-appearing CV:  Good peripheral perfusion.  Resp:  Normal effort.  Abd:  No distention.  Soft nontender Other:  No flank or CVA tenderness   ED Results / Procedures / Treatments   Labs (all labs ordered are listed, but only abnormal results are displayed) Labs Reviewed  COMPREHENSIVE METABOLIC PANEL WITH GFR -  Abnormal; Notable for the following components:      Result Value   Sodium 134 (*)    Alkaline Phosphatase 35 (*)    All other components within normal limits  URINALYSIS, ROUTINE W REFLEX MICROSCOPIC - Abnormal; Notable for the following components:   Color, Urine STRAW (*)    APPearance CLEAR (*)    Specific Gravity, Urine 1.002 (*)    Hgb urine dipstick LARGE (*)    Bacteria, UA RARE (*)    All other components within normal limits  CHLAMYDIA/NGC RT PCR ([facility] ONLY)            CBC      PROCEDURES:  Critical Care performed: No  Procedures   MEDICATIONS ORDERED IN ED: Medications  ketorolac  (TORADOL ) 30 MG/ML injection 15 mg (has no administration in time range)  tamsulosin  (FLOMAX ) capsule 0.4 mg (has no administration in time range)     IMPRESSION / MDM / ASSESSMENT AND PLAN / ED COURSE  I reviewed the triage  vital signs and the nursing notes.                              Differential diagnosis includes, but is not limited to, UTI, nephrolithiasis, STI.  Labs as well as UA were obtained at triage, will add on GC chlamydia.  Shared decision making done with patient and offered CT scan to look for kidney stones but patient declined, states that he would like some Flomax  which has helped with expelling his prior kidney stones in the past.  States that he has urology follow-up and does not need a new referral.  Instructed him to take ibuprofen or Tylenol  as needed for pain.  He does not want to start prophylactic treatment for STI testing but is agreeable to have the labs sent.  Patient's presentation is most consistent with acute presentation with potential threat to life or bodily function.  Independent review of labs below.  Will order him some Toradol  as well as Flomax  here.  Shared decision making the patient and he is agreeable plan for discharge, considered but no indication for inpatient admission at this time, he is safe for outpatient management.  Sent prescription  for cefdinir to his pharmacy.  Also included Flomax .  Will discharge with strict precautions.  Instructed him to follow-up with urology for further management of his symptoms.     Clinical Course as of 04/22/24 1658  Wed Apr 22, 2024  1630 UA without nitrites, leuk esterase, 0-5 WBCs, rare bacteria.  Given his dysuria, will treat him for UTI. [TT]  1632 Electrolyte severely deranged, no leukocytosis. [TT]    Clinical Course User Index [TT] Shane Darling, MD     FINAL CLINICAL IMPRESSION(S) / ED DIAGNOSES   Final diagnoses:  Dysuria  History of kidney stones  Urinary tract infection without hematuria, site unspecified     Rx / DC Orders   ED Discharge Orders          Ordered    tamsulosin  (FLOMAX ) 0.4 MG CAPS capsule  Daily        04/22/24 1633    cefdinir (OMNICEF) 300 MG capsule  2 times daily        04/22/24 1657             Note:  This document was prepared using Dragon voice recognition software and may include unintentional dictation errors.    Shane Darling, MD 04/22/24 [phone]

## 2024-04-22 NOTE — ED Notes (Signed)
Patient verbalizes understanding of discharge instructions. Opportunity for questioning and answers were provided. Armband removed by staff, pt discharged from ED. Ambulated out to lobby  

## 2024-08-11 ENCOUNTER — Emergency Department
Admission: EM | Admit: 2024-08-11 | Discharge: 2024-08-11 | Disposition: A | Discharge status: Home / Self Care | Attending: Emergency Medicine | Admitting: Emergency Medicine

## 2024-08-11 ENCOUNTER — Other Ambulatory Visit: Payer: Self-pay

## 2024-08-11 DIAGNOSIS — I10 Essential (primary) hypertension: Secondary | ICD-10-CM | POA: Diagnosis not present

## 2024-08-11 DIAGNOSIS — L03114 Cellulitis of left upper limb: Secondary | ICD-10-CM | POA: Diagnosis not present

## 2024-08-11 MED ORDER — DOXYCYCLINE HYCLATE 100 MG PO CAPS: 100.0000 mg | ORAL_CAPSULE | Freq: Two times a day (BID) | ORAL | 0 refills | Status: AC

## 2024-08-11 NOTE — ED Provider Notes (Signed)
   [facility] Provider Note    Event Date/Time   First MD Initiated Contact with Patient 08/11/24 1912     (approximate)   History   Wound Infection   HPI  Kenneth Craig is a 33 y.o. male with history of hypertension and as listed in EMR presents to the emergency department for concern for infection to the left forearm after tattoo applied 3 days ago.  Yesterday, he noticed small pus pockets in multiple different areas.  He denies fever..     Physical Exam    Vitals:   08/11/24 1901  BP: (!) 133/91  Pulse: 87  Resp: 18  Temp: 97.6 F (36.4 C)  SpO2: 100%    General: Awake, no distress.  CV:  Good peripheral perfusion.  Resp:  Normal effort.  Abd:  No distention.  Other:  Scattered vesicular lesions filled with purulent drainage noted surrounding recently applied tattoo on the left lesions filled with purulent drainage.  There is also surrounding erythema without lymphangitis   ED Results / Procedures / Treatments   Labs  Labs Reviewed - No data to display   EKG  Not indicated   RADIOLOGY  Image and radiology report reviewed and interpreted by me. Radiology report consistent with the same.  Not indicated  PROCEDURES:  Critical Care performed: No  Procedures   MEDICATIONS ORDERED IN ED:  Medications - No data to display   IMPRESSION / MDM / ASSESSMENT AND PLAN / ED COURSE   I have reviewed the triage note and vital signs. Vital signs stable   Differential diagnosis includes, but is not limited to, cellulitis, lymphangitis, impetigo, allergic reaction  Patient's presentation is most consistent with acute illness / injury with system symptoms.  33 year old male presenting to the emergency department 3 days after having a tattoo applied to the left forearm.  He now has purulent pockets in scattered areas along with erythema.  See HPI and exam for details.  Plan will be to place patient on doxycycline  and have him  follow-up with his primary care provider if not improving over the next few days.  ER return precautions discussed.      FINAL CLINICAL IMPRESSION(S) / ED DIAGNOSES   Final diagnoses:  Cellulitis of left upper extremity     Rx / DC Orders   ED Discharge Orders          Ordered    doxycycline  (VIBRAMYCIN ) 100 MG capsule  2 times daily        08/11/24 1919             Note:  This document was prepared using Dragon voice recognition software and may include unintentional dictation errors.   Herlinda Kirk NOVAK, FNP 08/11/24 2315    Bradler, Evan K, MD 08/12/24 [phone]

## 2024-08-11 NOTE — ED Triage Notes (Signed)
 Pt reports he got a tattoo to his left forearm 3 days ago, pt developed pus pockets on tattoo yesterday. Pt denies pain
